# Patient Record
Sex: Female | Born: 1995 | Race: White | Hispanic: No | Marital: Single | Smoking: Never smoker
Health system: Southern US, Community
[De-identification: ages and names within clinical notes are randomized; demographics above are authoritative.]

## PROBLEM LIST (undated history)

## (undated) DIAGNOSIS — G40209 Localization-related (focal) (partial) symptomatic epilepsy and epileptic syndromes with complex partial seizures, not intractable, without status epilepticus: Secondary | ICD-10-CM

## (undated) DIAGNOSIS — F329 Major depressive disorder, single episode, unspecified: Secondary | ICD-10-CM

## (undated) DIAGNOSIS — F32A Depression, unspecified: Secondary | ICD-10-CM

## (undated) DIAGNOSIS — S060XAA Concussion with loss of consciousness status unknown, initial encounter: Secondary | ICD-10-CM

## (undated) DIAGNOSIS — J45909 Unspecified asthma, uncomplicated: Secondary | ICD-10-CM

## (undated) DIAGNOSIS — M1990 Unspecified osteoarthritis, unspecified site: Secondary | ICD-10-CM

## (undated) DIAGNOSIS — K219 Gastro-esophageal reflux disease without esophagitis: Secondary | ICD-10-CM

## (undated) DIAGNOSIS — F909 Attention-deficit hyperactivity disorder, unspecified type: Secondary | ICD-10-CM

## (undated) HISTORY — DX: Localization-related (focal) (partial) symptomatic epilepsy and epileptic syndromes with complex partial seizures, not intractable, without status epilepticus: G40.209

## (undated) HISTORY — DX: Attention-deficit hyperactivity disorder, unspecified type: F90.9

## (undated) HISTORY — DX: Concussion with loss of consciousness status unknown, initial encounter: S06.0XAA

## (undated) HISTORY — DX: Unspecified asthma, uncomplicated: J45.909

## (undated) HISTORY — DX: Gastro-esophageal reflux disease without esophagitis: K21.9

---

## 2013-03-01 ENCOUNTER — Encounter (HOSPITAL_COMMUNITY): Payer: Self-pay | Admitting: Emergency Medicine

## 2013-03-01 ENCOUNTER — Emergency Department (HOSPITAL_COMMUNITY)
Admission: EM | Admit: 2013-03-01 | Discharge: 2013-03-01 | Disposition: A | Discharge status: Home / Self Care | Payer: BC Managed Care – PPO | Attending: Emergency Medicine | Admitting: Emergency Medicine

## 2013-03-01 DIAGNOSIS — H9202 Otalgia, left ear: Secondary | ICD-10-CM

## 2013-03-01 DIAGNOSIS — H6120 Impacted cerumen, unspecified ear: Secondary | ICD-10-CM | POA: Insufficient documentation

## 2013-03-01 DIAGNOSIS — H6122 Impacted cerumen, left ear: Secondary | ICD-10-CM

## 2013-03-01 HISTORY — DX: Major depressive disorder, single episode, unspecified: F32.9

## 2013-03-01 HISTORY — DX: Depression, unspecified: F32.A

## 2013-03-01 HISTORY — DX: Unspecified osteoarthritis, unspecified site: M19.90

## 2013-03-01 MED ORDER — IBUPROFEN 800 MG PO TABS: 800.0000 mg | ORAL_TABLET | Freq: Three times a day (TID) | ORAL | Status: DC | PRN

## 2013-03-01 NOTE — ED Provider Notes (Signed)
CSN: [id number]     Arrival date & time 03/01/13  1217 History  This chart was scribed for non-physician practitioner Ebbie Ridge, PA, working with Lyanne Co, MD by Ronal Fear, ED scribe. This patient was seen in room WTR9/WTR9 and the patient's care was started at 12:28 PM.    Chief Complaint  Patient presents with  . Otalgia   The history is provided by the patient. No language interpreter was used.    HPI Comments: Sonia Gordon is a 17 y.o. female who presents to the Emergency Department complaining of a left ear problem onset 9 am this morning.  She states that when she yawns it feels like there is something moving inside her ear.  She has had a live object in her ear before.  Pt does not appear to be in any acute distress with no other complaints.  No past medical history on file. No past surgical history on file. No family history on file. History  Substance Use Topics  . Smoking status: Not on file  . Smokeless tobacco: Not on file  . Alcohol Use: Not on file   OB History   No data available     Review of Systems  HENT: Positive for ear pain.   All other systems reviewed and are negative.    Allergies  Review of patient's allergies indicates not on file.  Home Medications  No current outpatient prescriptions on file. BP 114/64  Pulse 88  Temp(Src) 98.1 F (36.7 C) (Oral)  Resp 18  SpO2 100% Physical Exam  Nursing note and vitals reviewed. Constitutional: She is oriented to person, place, and time. She appears well-developed and well-nourished.  HENT:  Head: Normocephalic and atraumatic.  Right Ear: Tympanic membrane normal.  Left Ear: Tympanic membrane normal.  The patient has wax noted in the L ear canal.   Eyes: Pupils are equal, round, and reactive to light.  Neck: Neck supple.  Cardiovascular: Normal rate, regular rhythm and normal heart sounds.   Pulmonary/Chest: Effort normal. No respiratory distress. She has no wheezes.  Neurological: She is  alert and oriented to person, place, and time.  Skin: Skin is warm and dry.  Psychiatric: She has a normal mood and affect.    ED Course  Procedures (including critical care time) DIAGNOSTIC STUDIES: Oxygen Saturation is 100% on RA, normal by my interpretation.    COORDINATION OF CARE:    12:35 PM- Pt advised of plan for treatment cleaning the ear out and pt agrees.  I personally performed the services described in this documentation, which was scribed in my presence. The recorded information has been reviewed and is accurate.   Carlyle Dolly, PA-C 03/01/13 1309

## 2013-03-01 NOTE — ED Notes (Signed)
Patient began having pain in her ear about 3 hours ago. Unsure of what it is but has had a live object in her ear before and the pain is different today. Has muffled hearing today.

## 2013-03-01 NOTE — ED Provider Notes (Signed)
Medical screening examination/treatment/procedure(s) were performed by non-physician practitioner and as supervising physician I was immediately available for consultation/collaboration.  EKG Interpretation   None         Lyanne Co, MD 03/01/13 1601

## 2016-07-25 ENCOUNTER — Encounter (INDEPENDENT_AMBULATORY_CARE_PROVIDER_SITE_OTHER): Payer: Self-pay | Admitting: Internal Medicine

## 2016-08-01 ENCOUNTER — Encounter (INDEPENDENT_AMBULATORY_CARE_PROVIDER_SITE_OTHER): Payer: Self-pay | Admitting: Internal Medicine

## 2016-08-01 ENCOUNTER — Encounter (INDEPENDENT_AMBULATORY_CARE_PROVIDER_SITE_OTHER): Payer: Self-pay

## 2016-08-01 ENCOUNTER — Ambulatory Visit (INDEPENDENT_AMBULATORY_CARE_PROVIDER_SITE_OTHER): Payer: BLUE CROSS/BLUE SHIELD | Admitting: Internal Medicine

## 2016-08-01 VITALS — BP 110/72 | HR 72 | Temp 98.3°F | Ht 59.0 in | Wt 103.8 lb

## 2016-08-01 DIAGNOSIS — K219 Gastro-esophageal reflux disease without esophagitis: Secondary | ICD-10-CM

## 2016-08-01 DIAGNOSIS — J45909 Unspecified asthma, uncomplicated: Secondary | ICD-10-CM

## 2016-08-01 HISTORY — DX: Unspecified asthma, uncomplicated: J45.909

## 2016-08-01 NOTE — Progress Notes (Addendum)
   Subjective:    Patient ID: Sonia Gordon, female    DOB: [birth year], 21 y.o.   MRN: [id number]  HPI  Referred by Chauncey Reading PA-C for GERD.  She says she has mid abdominal pain. She has had the pain for a couple of years.  She describes the pain as very mild.  Really no pain today. She does have acid reflux and takes Omeprazole as needed. Worse when she eats dairy products and soy, and almonds.  She is avoiding these foods. She avoids spicy foods.  For the most part her appetite is not good.  She has a BM 2-3 times a day.  Seen 07/06/2016 at their office with abdominal pain, nausea, and diarrhea x 3 days.   07/12/2014 total bili 1.2, ALP 58, AST 14, ALT 156  Review of Systems Past Medical History:  Diagnosis Date  . Arthritis   . Asthma 08/01/2016  . Depression     No past surgical history on file.  No Known Allergies  Current Outpatient Prescriptions on File Prior to Visit  Medication Sig Dispense Refill  . ibuprofen (ADVIL,MOTRIN) 800 MG tablet Take 1 tablet (800 mg total) by mouth every 8 (eight) hours as needed. 21 tablet 0   No current facility-administered medications on file prior to visit.        Objective:   Physical Exam Blood pressure 110/72, pulse 72, temperature 98.3 F (36.8 C), height  (1.499 m), weight 103 lb 12.8 oz (47.1 kg).  Alert and oriented. Skin warm and dry. Oral mucosa is moist.   . Sclera anicteric, conjunctivae is pink. Thyroid not enlarged. No cervical lymphadenopathy. Lungs clear. Heart regular rate and rhythm.  Abdomen is soft. Bowel sounds are positive. No hepatomegaly. No abdominal masses felt. No tenderness.  No edema to lower extremities.       Assessment & Plan:  Probable GERD. She will increase the Omeprazole  to daily.  OV in 3 months. GERD diet given to patient.

## 2016-08-01 NOTE — Patient Instructions (Signed)
Take the Omeprazole  Daily. GERD diet.

## 2016-08-07 ENCOUNTER — Encounter (INDEPENDENT_AMBULATORY_CARE_PROVIDER_SITE_OTHER): Payer: Self-pay

## 2016-10-31 ENCOUNTER — Encounter (INDEPENDENT_AMBULATORY_CARE_PROVIDER_SITE_OTHER): Payer: Self-pay

## 2016-10-31 ENCOUNTER — Encounter (INDEPENDENT_AMBULATORY_CARE_PROVIDER_SITE_OTHER): Payer: Self-pay | Admitting: Internal Medicine

## 2016-10-31 ENCOUNTER — Ambulatory Visit (INDEPENDENT_AMBULATORY_CARE_PROVIDER_SITE_OTHER): Payer: BLUE CROSS/BLUE SHIELD | Admitting: Internal Medicine

## 2016-10-31 DIAGNOSIS — K219 Gastro-esophageal reflux disease without esophagitis: Secondary | ICD-10-CM | POA: Diagnosis not present

## 2016-10-31 HISTORY — DX: Gastro-esophageal reflux disease without esophagitis: K21.9

## 2016-10-31 NOTE — Progress Notes (Signed)
   Subjective:    Patient ID: Sonia PilgrimAmber Denapoli, female    DOB: 1996-04-23, 21 y.o.   MRN: [id number] Wt in April 103.  HPI Here today for f/u. She was last seen in April of this year. Hx of GERD. Maintained on Omeprazole 20mg  daily.  She tells she is doing good. Her appetite is good. She is avoiding spicy foods. She is having a BM x 1 every 2-3 days. She continues to work full time.     Review of Systems Past Medical History:  Diagnosis Date  . Arthritis   . Asthma 08/01/2016  . Depression   . GERD (gastroesophageal reflux disease) 10/31/2016    No past surgical history on file.  No Known Allergies  Current Outpatient Prescriptions on File Prior to Visit  Medication Sig Dispense Refill  . albuterol (VENTOLIN HFA) 108 (90 Base) MCG/ACT inhaler Inhale into the lungs every 6 (six) hours as needed for wheezing or shortness of breath.    Marland Kitchen. ibuprofen (ADVIL,MOTRIN) 800 MG tablet Take 1 tablet (800 mg total) by mouth every 8 (eight) hours as needed. 21 tablet 0  . naproxen sodium (ANAPROX) 220 MG tablet Take 220 mg by mouth as needed.    Marland Kitchen. omeprazole (PRILOSEC) 20 MG capsule Take 20 mg by mouth as needed.    . traZODone (DESYREL) 50 MG tablet Take 50 mg by mouth at bedtime.    . Vitamin D, Ergocalciferol, (DRISDOL) 50000 units CAPS capsule Take 50,000 Units by mouth every 7 (seven) days.     No current facility-administered medications on file prior to visit.         Objective:   Physical Exam Blood pressure 100/70, pulse 68, temperature (!) 97.4 F (36.3 C), height 4\' 11"  (1.499 m), weight 111 lb 6.4 oz (50.5 kg).  Alert and oriented. Skin warm and dry. Oral mucosa is moist.   . Sclera anicteric, conjunctivae is pink. Thyroid not enlarged. No cervical lymphadenopathy. Lungs clear. Heart regular rate and rhythm.  Abdomen is soft. Bowel sounds are positive. No hepatomegaly. No abdominal masses felt. No tenderness.  No edema to lower extremities.         Assessment & Plan:  GERD  controlled with Omeprazole. OV in 1 year.

## 2016-10-31 NOTE — Patient Instructions (Signed)
Continue the Omeprazole. OV  In 1 year.

## 2017-10-31 ENCOUNTER — Ambulatory Visit (INDEPENDENT_AMBULATORY_CARE_PROVIDER_SITE_OTHER): Payer: BLUE CROSS/BLUE SHIELD | Admitting: Internal Medicine

## 2017-10-31 ENCOUNTER — Encounter (INDEPENDENT_AMBULATORY_CARE_PROVIDER_SITE_OTHER): Payer: Self-pay | Admitting: Internal Medicine

## 2017-10-31 VITALS — BP 98/72 | HR 60 | Temp 98.0°F | Ht 59.0 in | Wt 105.7 lb

## 2017-10-31 DIAGNOSIS — K219 Gastro-esophageal reflux disease without esophagitis: Secondary | ICD-10-CM

## 2017-10-31 NOTE — Progress Notes (Signed)
   Subjective:    Patient ID: Sonia PilgrimAmber Cress, female    DOB: [birth year], 22 y.o.   MRN: [id number]  HPI Here today for f/u. Last seen in July of 2018. Hx of chronic GERD and maintained on Omeprazole.  She says she is doing good. Her appetite is good. She has lost 5 pounds since her last visit in July. She states she has been walking. Has a BM x 1-2 times a day. No change in stool habits. Stools are formed. No melena or BRRB.  No abdominal pain. GERD controlled with Omeprazole. She avoids lactulose, spicy foods, and tries to avoid greasy foods.     Review of Systems Past Medical History:  Diagnosis Date  . Arthritis   . Asthma 08/01/2016  . Depression   . GERD (gastroesophageal reflux disease) 10/31/2016    History reviewed. No pertinent surgical history.  No Known Allergies  Current Outpatient Medications on File Prior to Visit  Medication Sig Dispense Refill  . albuterol (VENTOLIN HFA) 108 (90 Base) MCG/ACT inhaler Inhale into the lungs every 6 (six) hours as needed for wheezing or shortness of breath.    Marland Kitchen. ibuprofen (ADVIL,MOTRIN) 800 MG tablet Take 1 tablet (800 mg total) by mouth every 8 (eight) hours as needed. 21 tablet 0  . naproxen sodium (ANAPROX) 220 MG tablet Take 220 mg by mouth as needed.    Marland Kitchen. omeprazole (PRILOSEC) 20 MG capsule Take 20 mg by mouth as needed.    . traZODone (DESYREL) 50 MG tablet Take 50 mg by mouth at bedtime.     No current facility-administered medications on file prior to visit.         Objective:   Physical Exam Blood pressure 98/72, pulse 60, temperature 98 F (36.7 C), height 4\' 11"  (1.499 m), weight 105 lb 11.2 oz (47.9 kg).  Alert and oriented. Skin warm and dry. Oral mucosa is moist.   . Sclera anicteric, conjunctivae is pink. Thyroid not enlarged. No cervical lymphadenopathy. Lungs clear. Heart regular rate and rhythm.  Abdomen is soft. Bowel sounds are positive. No hepatomegaly. No abdominal masses felt. No tenderness.  No edema to lower  extremities.         Assessment & Plan:  GERD. She is doing well. She will continue the Omeprazole. OV in 1 year.

## 2017-10-31 NOTE — Patient Instructions (Signed)
OV in 1 year.  

## 2018-10-31 ENCOUNTER — Encounter (INDEPENDENT_AMBULATORY_CARE_PROVIDER_SITE_OTHER): Payer: Self-pay | Admitting: Internal Medicine

## 2018-10-31 ENCOUNTER — Ambulatory Visit (INDEPENDENT_AMBULATORY_CARE_PROVIDER_SITE_OTHER): Payer: BC Managed Care – PPO | Admitting: Internal Medicine

## 2018-10-31 ENCOUNTER — Other Ambulatory Visit: Payer: Self-pay

## 2018-10-31 VITALS — BP 115/71 | HR 101 | Temp 98.4°F | Ht 59.0 in | Wt 107.4 lb

## 2018-10-31 DIAGNOSIS — K219 Gastro-esophageal reflux disease without esophagitis: Secondary | ICD-10-CM | POA: Diagnosis not present

## 2018-10-31 NOTE — Progress Notes (Signed)
   Subjective:    Patient ID: Sonia Gordon, female    DOB: June 03, 1995, 23 y.o.   MRN: [id number]  HPI Here today for f/u. Hx of chronic GERD and maintained on Omeprazole 20mg .  She tells me she is doing good. GERD controlled with Omeprazole. She watches what she eats. She avoids spicy foods. BMs move normal. No melena or BRRB.  NO GI complaints. She exercises by walking to work 1-2 times a weeks (3 miles).     Review of Systems Past Medical History:  Diagnosis Date  . Arthritis   . Asthma 08/01/2016  . Depression   . GERD (gastroesophageal reflux disease) 10/31/2016    History reviewed. No pertinent surgical history.  No Known Allergies  Current Outpatient Medications on File Prior to Visit  Medication Sig Dispense Refill  . albuterol (VENTOLIN HFA) 108 (90 Base) MCG/ACT inhaler Inhale into the lungs every 6 (six) hours as needed for wheezing or shortness of breath.    Marland Kitchen ibuprofen (ADVIL,MOTRIN) 800 MG tablet Take 1 tablet (800 mg total) by mouth every 8 (eight) hours as needed. 21 tablet 0  . naproxen sodium (ANAPROX) 220 MG tablet Take 220 mg by mouth as needed.    Marland Kitchen omeprazole (PRILOSEC) 20 MG capsule Take 20 mg by mouth as needed.    . traZODone (DESYREL) 50 MG tablet Take 50 mg by mouth at bedtime.     No current facility-administered medications on file prior to visit.         Objective:   Physical Exam Blood pressure 115/71, pulse (!) 101, temperature 98.4 F (36.9 C), height 4\' 11"  (1.499 m), weight 107 lb 6.4 oz (48.7 kg). Alert and oriented. Skin warm and dry. Oral mucosa is moist.   . Sclera anicteric, conjunctivae is pink. Thyroid not enlarged. No cervical lymphadenopathy. Lungs clear. Heart regular rate and rhythm.  Abdomen is soft. Bowel sounds are positive. No hepatomegaly. No abdominal masses felt. No tenderness.  No edema to lower extremities.        Assessment & Plan:  GERD. Continue the Omeprazole.  She is doing well. Will see back in 1 year.

## 2018-10-31 NOTE — Patient Instructions (Signed)
Continue the GERD.

## 2018-11-01 ENCOUNTER — Ambulatory Visit (INDEPENDENT_AMBULATORY_CARE_PROVIDER_SITE_OTHER): Payer: BLUE CROSS/BLUE SHIELD | Admitting: Internal Medicine

## 2019-08-15 ENCOUNTER — Encounter (HOSPITAL_COMMUNITY): Payer: Self-pay | Admitting: *Deleted

## 2019-08-15 ENCOUNTER — Emergency Department (HOSPITAL_COMMUNITY)
Admission: EM | Admit: 2019-08-15 | Discharge: 2019-08-15 | Disposition: A | Discharge status: Home / Self Care | Payer: BC Managed Care – PPO | Attending: Emergency Medicine | Admitting: Emergency Medicine

## 2019-08-15 ENCOUNTER — Other Ambulatory Visit: Payer: Self-pay

## 2019-08-15 DIAGNOSIS — A084 Viral intestinal infection, unspecified: Secondary | ICD-10-CM

## 2019-08-15 DIAGNOSIS — J45909 Unspecified asthma, uncomplicated: Secondary | ICD-10-CM | POA: Insufficient documentation

## 2019-08-15 DIAGNOSIS — R112 Nausea with vomiting, unspecified: Secondary | ICD-10-CM | POA: Diagnosis present

## 2019-08-15 DIAGNOSIS — K529 Noninfective gastroenteritis and colitis, unspecified: Secondary | ICD-10-CM | POA: Diagnosis not present

## 2019-08-15 DIAGNOSIS — Z20822 Contact with and (suspected) exposure to covid-19: Secondary | ICD-10-CM | POA: Diagnosis not present

## 2019-08-15 LAB — CBC WITH DIFFERENTIAL/PLATELET
Abs Immature Granulocytes: 0.07 10*3/uL (ref 0.00–0.07)
Basophils Absolute: 0.1 10*3/uL (ref 0.0–0.1)
Basophils Relative: 0 %
Eosinophils Absolute: 0 10*3/uL (ref 0.0–0.5)
Eosinophils Relative: 0 %
HCT: 45.8 % (ref 36.0–46.0)
Hemoglobin: 15.4 g/dL — ABNORMAL HIGH (ref 12.0–15.0)
Immature Granulocytes: 0 %
Lymphocytes Relative: 3 %
Lymphs Abs: 0.4 10*3/uL — ABNORMAL LOW (ref 0.7–4.0)
MCH: 29.9 pg (ref 26.0–34.0)
MCHC: 33.6 g/dL (ref 30.0–36.0)
MCV: 88.9 fL (ref 80.0–100.0)
Monocytes Absolute: 0.5 10*3/uL (ref 0.1–1.0)
Monocytes Relative: 3 %
Neutro Abs: 15.3 10*3/uL — ABNORMAL HIGH (ref 1.7–7.7)
Neutrophils Relative %: 94 %
Platelets: 264 10*3/uL (ref 150–400)
RBC: 5.15 MIL/uL — ABNORMAL HIGH (ref 3.87–5.11)
RDW: 12.1 % (ref 11.5–15.5)
WBC: 16.4 10*3/uL — ABNORMAL HIGH (ref 4.0–10.5)
nRBC: 0 % (ref 0.0–0.2)

## 2019-08-15 LAB — BASIC METABOLIC PANEL
Anion gap: 12 (ref 5–15)
BUN: 12 mg/dL (ref 6–20)
CO2: 20 mmol/L — ABNORMAL LOW (ref 22–32)
Calcium: 9.3 mg/dL (ref 8.9–10.3)
Chloride: 104 mmol/L (ref 98–111)
Creatinine, Ser: 0.61 mg/dL (ref 0.44–1.00)
GFR calc Af Amer: >60 mL/min (ref >60)
GFR calc non Af Amer: >60 mL/min (ref >60)
Glucose, Bld: 114 mg/dL — ABNORMAL HIGH (ref 70–99)
Potassium: 5 mmol/L (ref 3.5–5.1)
Sodium: 136 mmol/L (ref 135–145)

## 2019-08-15 MED ORDER — ONDANSETRON HCL 4 MG/2ML IJ SOLN
4.0000 mg | Freq: Once | INTRAMUSCULAR | Status: AC
  Administered 2019-08-15: 4 mg via INTRAVENOUS
  Filled 2019-08-15: qty 2

## 2019-08-15 MED ORDER — PROCHLORPERAZINE EDISYLATE 10 MG/2ML IJ SOLN
10.0000 mg | Freq: Once | INTRAMUSCULAR | Status: AC
  Administered 2019-08-15: 10 mg via INTRAVENOUS
  Filled 2019-08-15: qty 2

## 2019-08-15 MED ORDER — SODIUM CHLORIDE 0.9 % IV BOLUS
1000.0000 mL | Freq: Once | INTRAVENOUS | Status: AC
  Administered 2019-08-15: 11:00:00 1000 mL via INTRAVENOUS

## 2019-08-15 MED ORDER — DICYCLOMINE HCL 20 MG PO TABS
20.0000 mg | ORAL_TABLET | Freq: Once | ORAL | Status: DC
  Filled 2019-08-15: qty 1

## 2019-08-15 MED ORDER — DICYCLOMINE HCL 10 MG PO CAPS
20.0000 mg | ORAL_CAPSULE | Freq: Once | ORAL | Status: AC
  Administered 2019-08-15: 20 mg via ORAL
  Filled 2019-08-15: qty 2

## 2019-08-15 NOTE — ED Provider Notes (Signed)
[facility] EMERGENCY DEPARTMENT Provider Note  CSN: [id number] Arrival date & time: 08/15/19  4098     History Chief Complaint  Patient presents with  . Abdominal Pain    Sonia Gordon is a 24 y.o. female with a history of reflux, ADHD, asthma, presenting to emergency department nausea vomiting diarrhea.  Patient's brother had similar symptoms 4 days ago, and the patient's mother has had the same symptoms last night.  Patient reports that she has had about 8 episodes of vomiting, with some blood-tinged this morning.  She is also had about 8 episodes of diarrhea as well which is nonbloody.  She denies fevers or chills.  She reports cramping lower abdominal pain.  She denies any respiratory symptoms.  She has not had a Covid vaccine yet.  HPI     Past Medical History:  Diagnosis Date  . Arthritis   . Asthma 08/01/2016  . Depression   . GERD (gastroesophageal reflux disease) 10/31/2016    Patient Active Problem List   Diagnosis Date Noted  . GERD (gastroesophageal reflux disease) 10/31/2016  . Asthma 08/01/2016    History reviewed. No pertinent surgical history.   OB History   No obstetric history on file.     History reviewed. No pertinent family history.  Social History   Tobacco Use  . Smoking status: Never Smoker  . Smokeless tobacco: Never Used  Substance Use Topics  . Alcohol use: No  . Drug use: No    Home Medications Prior to Admission medications   Medication Sig Start Date End Date Taking? Authorizing Provider  albuterol (VENTOLIN HFA) 108 (90 Base) MCG/ACT inhaler Inhale into the lungs every 6 (six) hours as needed for wheezing or shortness of breath.   Yes [provider]  amphetamine-dextroamphetamine (ADDERALL XR) 20 MG 24 hr capsule Take 1 capsule by mouth daily. 08/12/19  Yes [provider]  meloxicam (MOBIC) 7.5 MG tablet Take 7.5 mg by mouth daily. 08/12/19  Yes [provider]  sulfamethoxazole-trimethoprim (BACTRIM DS)  800-160 MG tablet Take 1 tablet by mouth 2 (two) times daily. 08/12/19  Yes [provider]    Allergies    Patient has no known allergies.  Review of Systems   Review of Systems  Constitutional: Negative for chills and fever.  HENT: Negative for ear pain and sore throat.   Eyes: Negative for photophobia and visual disturbance.  Respiratory: Negative for cough and shortness of breath.   Cardiovascular: Negative for chest pain and palpitations.  Gastrointestinal: Positive for abdominal pain, diarrhea, nausea and vomiting. Negative for blood in stool.  Genitourinary: Negative for difficulty urinating, dysuria and hematuria.  Musculoskeletal: Negative for arthralgias and back pain.  Skin: Negative for color change and rash.  Neurological: Negative for syncope and light-headedness.  Psychiatric/Behavioral: Negative for agitation and confusion.  All other systems reviewed and are negative.   Physical Exam Updated Vital Signs BP 120/66 (BP Location: Left Arm)   Pulse 82   Temp 98.1 F (36.7 C) (Oral)   Resp 17   Ht 4\' 11"  (1.499 m)   Wt 49.4 kg   LMP 07/15/2019   SpO2 99%   BMI 22.02 kg/m   Physical Exam Vitals and nursing note reviewed.  Constitutional:      General: She is not in acute distress.    Appearance: She is well-developed.  HENT:     Head: Normocephalic and atraumatic.  Eyes:     Conjunctiva/sclera: Conjunctivae normal.  Cardiovascular:  Rate and Rhythm: Normal rate and regular rhythm.     Heart sounds: No murmur.  Pulmonary:     Effort: Pulmonary effort is normal. No respiratory distress.     Breath sounds: Normal breath sounds.  Abdominal:     Palpations: Abdomen is soft.     Tenderness: There is no abdominal tenderness. There is no right CVA tenderness, left CVA tenderness or guarding. Negative signs include Murphy's sign and McBurney's sign.  Musculoskeletal:     Cervical back: Neck supple.  Skin:    General: Skin is warm and dry.    Neurological:     General: No focal deficit present.     Mental Status: She is alert and oriented to person, place, and time.  Psychiatric:        Mood and Affect: Mood normal.        Behavior: Behavior normal.     ED Results / Procedures / Treatments   Labs (all labs ordered are listed, but only abnormal results are displayed) Labs Reviewed  BASIC METABOLIC PANEL - Abnormal; Notable for the following components:      Result Value   CO2 20 (*)    Glucose, Bld 114 (*)    All other components within normal limits  CBC WITH DIFFERENTIAL/PLATELET - Abnormal; Notable for the following components:   WBC 16.4 (*)    RBC 5.15 (*)    Hemoglobin 15.4 (*)    Neutro Abs 15.3 (*)    Lymphs Abs 0.4 (*)    All other components within normal limits  SARS CORONAVIRUS 2 (TAT 6-24 HRS)    EKG None  Radiology No results found.  Procedures Procedures (including critical care time)  Medications Ordered in ED Medications  sodium chloride 0.9 % bolus 1,000 mL (0 mLs Intravenous Stopped 08/15/19 1223)  ondansetron (ZOFRAN) injection 4 mg (4 mg Intravenous Given 08/15/19 1109)  prochlorperazine (COMPAZINE) injection 10 mg (10 mg Intravenous Given 08/15/19 1110)  dicyclomine (BENTYL) capsule 20 mg (20 mg Oral Given 08/15/19 1110)    ED Course  I have reviewed the triage vital signs and the nursing notes.  Pertinent labs & imaging results that were available during my care of the patient were reviewed by me and considered in my medical decision making (see chart for details).  24 yo female here with n/v/d since last night Multiple family members with the same complaints this week  Strongly suspect this is a viral gastroenteritis No clear food source to suggest food poisoning.  Her brother had these symptoms over 4 days ago, and eats a different diet.  We'll check basic labs to evaluate hydration status, give some IV fluids, IV zofran, IV compazine for nausea and cramping If she can  tolerate PO she can be discharged  Doubtful this is appendicitis, cholecystitis, or acute surgical emergency with this clinical presentation  Overall lower suspicion for COVID without fever or respiratory symptoms, but it is not unreasonable to test them, and she does have asthma, so we'll order the test  Sonia Gordon was evaluated in Emergency Department on 08/15/2019 for the symptoms described in the history of present illness. She was evaluated in the context of the global COVID-19 pandemic, which necessitated consideration that the patient might be at risk for infection with the SARS-CoV-2 virus that causes COVID-19. Institutional protocols and algorithms that pertain to the evaluation of patients at risk for COVID-19 are in a state of rapid change based on information released by regulatory bodies including the  CDC and federal and [employer]. These policies and algorithms were followed during the patient's care in the ED.   Clinical Course as of Aug 14 1829  Fri Aug 15, 2019  1319 Symptoms improved.  Patient is tolerating p.o.  She feels much better.  Suspect leukocytosis is related to her vomiting.  She continues to have a benign abdominal exam and have a lower suspicion for appendicitis.  I will discharge her home with Zofran prescription.   [MT]    Clinical Course User Index [MT] Ronnie Doo, Kermit Balo, MD    Final Clinical Impression(s) / ED Diagnoses Final diagnoses:  Viral gastroenteritis    Rx / DC Orders ED Discharge Orders    None       Renaye Rakers Kermit Balo, MD 08/15/19 [id number]

## 2019-08-15 NOTE — ED Triage Notes (Signed)
Patient comes to the Ed after advised by PCP for evaluation of lower abdominal pan and nausea and vomiting for one day.  Patient reported to EMS some streaks of blood when vomiting.

## 2019-08-16 LAB — SARS CORONAVIRUS 2 (TAT 6-24 HRS): SARS Coronavirus 2: NEGATIVE

## 2019-11-04 ENCOUNTER — Ambulatory Visit (INDEPENDENT_AMBULATORY_CARE_PROVIDER_SITE_OTHER): Payer: BC Managed Care – PPO | Admitting: Internal Medicine

## 2020-09-25 ENCOUNTER — Emergency Department (HOSPITAL_COMMUNITY): Payer: BC Managed Care – PPO

## 2020-09-25 ENCOUNTER — Encounter (HOSPITAL_COMMUNITY): Payer: Self-pay | Admitting: Emergency Medicine

## 2020-09-25 ENCOUNTER — Other Ambulatory Visit: Payer: Self-pay

## 2020-09-25 ENCOUNTER — Emergency Department (HOSPITAL_COMMUNITY)
Admission: EM | Admit: 2020-09-25 | Discharge: 2020-09-26 | Disposition: A | Discharge status: Home / Self Care | Payer: BC Managed Care – PPO | Attending: Emergency Medicine | Admitting: Emergency Medicine

## 2020-09-25 DIAGNOSIS — R42 Dizziness and giddiness: Secondary | ICD-10-CM | POA: Insufficient documentation

## 2020-09-25 DIAGNOSIS — R519 Headache, unspecified: Secondary | ICD-10-CM | POA: Diagnosis not present

## 2020-09-25 DIAGNOSIS — R251 Tremor, unspecified: Secondary | ICD-10-CM | POA: Diagnosis not present

## 2020-09-25 DIAGNOSIS — J45909 Unspecified asthma, uncomplicated: Secondary | ICD-10-CM | POA: Diagnosis not present

## 2020-09-25 LAB — CBC WITH DIFFERENTIAL/PLATELET
Abs Immature Granulocytes: 0.02 10*3/uL (ref 0.00–0.07)
Basophils Absolute: 0 10*3/uL (ref 0.0–0.1)
Basophils Relative: 0 %
Eosinophils Absolute: 0 10*3/uL (ref 0.0–0.5)
Eosinophils Relative: 0 %
HCT: 39.9 % (ref 36.0–46.0)
Hemoglobin: 13.2 g/dL (ref 12.0–15.0)
Immature Granulocytes: 0 %
Lymphocytes Relative: 24 %
Lymphs Abs: 1.7 10*3/uL (ref 0.7–4.0)
MCH: 29.2 pg (ref 26.0–34.0)
MCHC: 33.1 g/dL (ref 30.0–36.0)
MCV: 88.3 fL (ref 80.0–100.0)
Monocytes Absolute: 0.3 10*3/uL (ref 0.1–1.0)
Monocytes Relative: 5 %
Neutro Abs: 4.9 10*3/uL (ref 1.7–7.7)
Neutrophils Relative %: 71 %
Platelets: 300 10*3/uL (ref 150–400)
RBC: 4.52 MIL/uL (ref 3.87–5.11)
RDW: 12.1 % (ref 11.5–15.5)
WBC: 7 10*3/uL (ref 4.0–10.5)
nRBC: 0 % (ref 0.0–0.2)

## 2020-09-25 LAB — BASIC METABOLIC PANEL
Anion gap: 7 (ref 5–15)
BUN: 7 mg/dL (ref 6–20)
CO2: 26 mmol/L (ref 22–32)
Calcium: 8.9 mg/dL (ref 8.9–10.3)
Chloride: 104 mmol/L (ref 98–111)
Creatinine, Ser: 0.58 mg/dL (ref 0.44–1.00)
GFR, Estimated: >60 mL/min (ref >60)
Glucose, Bld: 93 mg/dL (ref 70–99)
Potassium: 3.4 mmol/L — ABNORMAL LOW (ref 3.5–5.1)
Sodium: 137 mmol/L (ref 135–145)

## 2020-09-25 LAB — CBG MONITORING, ED: Glucose-Capillary: 84 mg/dL (ref 70–99)

## 2020-09-25 MED ORDER — SODIUM CHLORIDE 0.9 % IV BOLUS
1000.0000 mL | Freq: Once | INTRAVENOUS | Status: AC
  Administered 2020-09-25: 1000 mL via INTRAVENOUS

## 2020-09-25 NOTE — ED Provider Notes (Signed)
[facility] EMERGENCY DEPARTMENT Provider Note   CSN: [id number] Arrival date & time: 09/25/20  2124     History Chief Complaint  Patient presents with  . Dizziness    Sonia Gordon is a 26 y.o. female.  Patient states while she is at work she got a headache felt dizzy and felt like she was shaking.  No fever no chills no vomiting  The history is provided by the patient and medical records. No language interpreter was used.  Dizziness Quality:  Lightheadedness Severity:  Mild Onset quality:  Sudden Timing:  Intermittent Chronicity:  New Context: not with bowel movement   Associated symptoms: headaches   Associated symptoms: no chest pain and no diarrhea        Past Medical History:  Diagnosis Date  . Arthritis   . Asthma 08/01/2016  . Depression   . GERD (gastroesophageal reflux disease) 10/31/2016    Patient Active Problem List   Diagnosis Date Noted  . GERD (gastroesophageal reflux disease) 10/31/2016  . Asthma 08/01/2016    History reviewed. No pertinent surgical history.   OB History   No obstetric history on file.     History reviewed. No pertinent family history.  Social History   Tobacco Use  . Smoking status: Never Smoker  . Smokeless tobacco: Never Used  Substance Use Topics  . Alcohol use: No  . Drug use: No    Home Medications Prior to Admission medications   Medication Sig Start Date End Date Taking? Authorizing Provider  albuterol (VENTOLIN HFA) 108 (90 Base) MCG/ACT inhaler Inhale into the lungs every 6 (six) hours as needed for wheezing or shortness of breath.    [provider]  amphetamine-dextroamphetamine (ADDERALL XR) 20 MG 24 hr capsule Take 1 capsule by mouth daily. 08/12/19   [provider]  meloxicam (MOBIC) 7.5 MG tablet Take 7.5 mg by mouth daily. 08/12/19   [provider]  sulfamethoxazole-trimethoprim (BACTRIM DS) 800-160 MG tablet Take 1 tablet by mouth 2 (two) times daily. 08/12/19   [provider]    Allergies    Patient has no known allergies.  Review of Systems   Review of Systems  Constitutional: Negative for appetite change and fatigue.  HENT: Negative for congestion, ear discharge and sinus pressure.   Eyes: Negative for discharge.  Respiratory: Negative for cough.   Cardiovascular: Negative for chest pain.  Gastrointestinal: Negative for abdominal pain and diarrhea.  Genitourinary: Negative for frequency and hematuria.  Musculoskeletal: Negative for back pain.  Skin: Negative for rash.  Neurological: Positive for dizziness and headaches. Negative for seizures.  Psychiatric/Behavioral: Negative for hallucinations.    Physical Exam Updated Vital Signs BP 122/84   Pulse 88   Temp 98.6 F (37 C) (Oral)   Resp (!) 21   Ht 4\' 11"  (1.499 m)   Wt 46.3 kg   SpO2 99%   BMI 20.60 kg/m   Physical Exam Vitals and nursing note reviewed.  Constitutional:      Appearance: She is well-developed.  HENT:     Head: Normocephalic.     Nose: Nose normal.  Eyes:     General: No scleral icterus.    Conjunctiva/sclera: Conjunctivae normal.  Neck:     Thyroid: No thyromegaly.  Cardiovascular:     Rate and Rhythm: Normal rate and regular rhythm.     Heart sounds: No murmur heard. No friction rub. No gallop.   Pulmonary:     Breath sounds: No stridor. No wheezing  or rales.  Chest:     Chest wall: No tenderness.  Abdominal:     General: There is no distension.     Tenderness: There is no abdominal tenderness. There is no rebound.  Musculoskeletal:        General: Normal range of motion.     Cervical back: Neck supple.  Lymphadenopathy:     Cervical: No cervical adenopathy.  Skin:    Findings: No erythema or rash.  Neurological:     Mental Status: She is alert and oriented to person, place, and time.     Motor: No abnormal muscle tone.     Coordination: Coordination normal.  Psychiatric:        Behavior: Behavior normal.     ED Results /  Procedures / Treatments   Labs (all labs ordered are listed, but only abnormal results are displayed) Labs Reviewed  BASIC METABOLIC PANEL  CBC WITH DIFFERENTIAL/PLATELET  URINALYSIS, ROUTINE W REFLEX MICROSCOPIC  PREGNANCY, URINE  CBG MONITORING, ED    EKG None  Radiology No results found.  Procedures Procedures   Medications Ordered in ED Medications  sodium chloride 0.9 % bolus 1,000 mL (1,000 mLs Intravenous New Bag/Given 09/25/20 2246)    ED Course  I have reviewed the triage vital signs and the nursing notes.  Pertinent labs & imaging results that were available during my care of the patient were reviewed by me and considered in my medical decision making (see chart for details).    MDM Rules/Calculators/A&P                          Patient with headache and dizziness that improved with fluids.  Also Tylenol.  Labs unremarkable.  Patient will follow up with PCP Final Clinical Impression(s) / ED Diagnoses Final diagnoses:  None    Rx / DC Orders ED Discharge Orders    None       Bethann Berkshire, MD 09/27/20 1135

## 2020-09-25 NOTE — ED Notes (Signed)
Pt up to bathroom, steady gait independently observed

## 2020-09-25 NOTE — Discharge Instructions (Addendum)
Drink plenty of fluids and follow-up with family doctor next week for recheck °

## 2020-09-25 NOTE — ED Triage Notes (Signed)
Pt c/o blurred vision tonight while at work and was concerned that she was going to have a seizure or pass out. Pt denies hx of same.

## 2020-09-26 LAB — PREGNANCY, URINE: Preg Test, Ur: NEGATIVE

## 2020-09-26 LAB — URINALYSIS, ROUTINE W REFLEX MICROSCOPIC
Bilirubin Urine: NEGATIVE
Glucose, UA: NEGATIVE mg/dL
Hgb urine dipstick: NEGATIVE
Ketones, ur: NEGATIVE mg/dL
Leukocytes,Ua: NEGATIVE
Nitrite: NEGATIVE
Protein, ur: NEGATIVE mg/dL
Specific Gravity, Urine: 1.005 (ref 1.005–1.030)
pH: 6 (ref 5.0–8.0)

## 2020-09-26 LAB — POC URINE PREG, ED: Preg Test, Ur: NEGATIVE

## 2020-09-26 MED ORDER — ACETAMINOPHEN 500 MG PO TABS
1000.0000 mg | ORAL_TABLET | Freq: Once | ORAL | Status: AC
  Administered 2020-09-26: 1000 mg via ORAL
  Filled 2020-09-26: qty 2

## 2020-09-26 NOTE — ED Provider Notes (Signed)
6:19 AM Assumed care from Dr. Estell Harpin, please see their note for full history, physical and decision making until this point. In brief this is a 25 y.o. year old female who presented to the ED tonight with Dizziness     Here with dizziness. No other asymptms. Pending workup.   Workup ok. Patient now with HA even though I had to wake her up from a deep sleep to find that out. Has ambulated multiple times without apparent problems. Stable for discharge.   Discharge instructions, including strict return precautions for new or worsening symptoms, given. Patient and/or family verbalized understanding and agreement with the plan as described.   Labs, studies and imaging reviewed by myself and considered in medical decision making if ordered. Imaging interpreted by radiology.  Labs Reviewed  BASIC METABOLIC PANEL - Abnormal; Notable for the following components:      Result Value   Potassium 3.4 (*)    All other components within normal limits  URINALYSIS, ROUTINE W REFLEX MICROSCOPIC - Abnormal; Notable for the following components:   Color, Urine STRAW (*)    All other components within normal limits  CBC WITH DIFFERENTIAL/PLATELET  PREGNANCY, URINE  CBG MONITORING, ED  POC URINE PREG, ED    CT Head Wo Contrast  Final Result      No follow-ups on file.    Marily Memos, MD 09/26/20 [phone]

## 2020-09-26 NOTE — ED Notes (Signed)
Walked pt in hall..steady on her feet.. did not complain of any dizziness

## 2020-10-11 ENCOUNTER — Other Ambulatory Visit: Payer: Self-pay

## 2020-10-11 DIAGNOSIS — Y990 Civilian activity done for income or pay: Secondary | ICD-10-CM | POA: Diagnosis not present

## 2020-10-11 DIAGNOSIS — J45909 Unspecified asthma, uncomplicated: Secondary | ICD-10-CM | POA: Diagnosis not present

## 2020-10-11 DIAGNOSIS — W208XXA Other cause of strike by thrown, projected or falling object, initial encounter: Secondary | ICD-10-CM | POA: Diagnosis not present

## 2020-10-11 DIAGNOSIS — S0990XA Unspecified injury of head, initial encounter: Secondary | ICD-10-CM | POA: Diagnosis not present

## 2020-10-11 DIAGNOSIS — M542 Cervicalgia: Secondary | ICD-10-CM | POA: Insufficient documentation

## 2020-10-12 ENCOUNTER — Other Ambulatory Visit: Payer: Self-pay

## 2020-10-12 ENCOUNTER — Encounter (HOSPITAL_COMMUNITY): Payer: Self-pay | Admitting: Emergency Medicine

## 2020-10-12 ENCOUNTER — Emergency Department (HOSPITAL_COMMUNITY)
Admission: EM | Admit: 2020-10-12 | Discharge: 2020-10-12 | Disposition: A | Discharge status: Home / Self Care | Payer: BC Managed Care – PPO | Attending: Emergency Medicine | Admitting: Emergency Medicine

## 2020-10-12 ENCOUNTER — Emergency Department (HOSPITAL_COMMUNITY): Payer: BC Managed Care – PPO

## 2020-10-12 DIAGNOSIS — S0990XA Unspecified injury of head, initial encounter: Secondary | ICD-10-CM

## 2020-10-12 NOTE — Discharge Instructions (Addendum)
You were evaluated in the Emergency Department and after careful evaluation, we did not find any emergent condition requiring admission or further testing in the hospital.  Your exam/testing today was overall reassuring.  Your CT scans did not show any significant injuries.  Your symptoms may be due to a concussion.  We recommend mental and physical rest for the next 2 days and slow return to your normal daily activities.  Please return to the Emergency Department if you experience any worsening of your condition.  Thank you for allowing [place] to be a part of your care.

## 2020-10-12 NOTE — ED Provider Notes (Signed)
[facility]-EMERGENCY DEPT [facility] Emergency Department Provider Note MRN:  [id number]  Arrival date & time: 10/12/20     Chief Complaint   Head Injury   History of Present Illness   Sonia Gordon is a 25 y.o. year-old adult with no pertinent past medical history presenting to the ED with chief complaint of head injury.  Patient was stacking wood bundles and the pile began to fall and a 20 pound piece of wood struck her in the back of the head.  Her neck was forced forward.  She is endorsing headache, neck pain.  Denies any other injuries, no chest pain or shortness of breath, no back pain, no numbness or weakness to the arms or legs.  Symptoms are mild to moderate, constant, no exacerbating or alleviating factors.  She reports that she fell asleep during dinner but does not recall this happening.  Review of Systems  A complete 10 system review of systems was obtained and all systems are negative except as noted in the HPI and PMH.   Patient's Health History    Past Medical History:  Diagnosis Date   Arthritis    Asthma 08/01/2016   Depression    GERD (gastroesophageal reflux disease) 10/31/2016    History reviewed. No pertinent surgical history.  History reviewed. No pertinent family history.  Social History   Socioeconomic History   Marital status: Significant Other    Spouse name: Not on file   Number of children: Not on file   Years of education: Not on file   Highest education level: Not on file  Occupational History   Not on file  Tobacco Use   Smoking status: Never   Smokeless tobacco: Never  Substance and Sexual Activity   Alcohol use: No   Drug use: No   Sexual activity: Never  Other Topics Concern   Not on file  Social History Narrative   Not on file   Social Determinants of Health   Financial Resource Strain: Not on file  Food Insecurity: Not on file  Transportation Needs: Not on file  Physical Activity: Not on file  Stress: Not on file  Social  Connections: Not on file  Intimate Partner Violence: Not on file     Physical Exam   Vitals:   10/12/20 0010  BP: 126/82  Pulse: (!) 104  Resp: 16  Temp: 97.9 F (36.6 C)  SpO2: 100%    CONSTITUTIONAL: Well-appearing, NAD NEURO:  Alert and oriented x 3, no focal deficits EYES:  eyes equal and reactive ENT/NECK:  no LAD, no JVD CARDIO: Regular rate, well-perfused, normal S1 and S2 PULM:  CTAB no wheezing or rhonchi GI/GU:  normal bowel sounds, non-distended, non-tender MSK/SPINE:  No gross deformities, no edema SKIN:  no rash, atraumatic PSYCH:  Appropriate speech and behavior  *Additional and/or pertinent findings included in MDM below  Diagnostic and Interventional Summary    EKG Interpretation  Date/Time:    Ventricular Rate:    PR Interval:    QRS Duration:   QT Interval:    QTC Calculation:   R Axis:     Text Interpretation:          Labs Reviewed - No data to display  CT HEAD WO CONTRAST  Final Result    CT CERVICAL SPINE WO CONTRAST  Final Result      Medications - No data to display   Procedures  /  Critical Care Procedures  ED Course and Medical Decision Making  I  have reviewed the triage vital signs, the nursing notes, and pertinent available records from the EMR.  Listed above are laboratory and imaging tests that I personally ordered, reviewed, and interpreted and then considered in my medical decision making (see below for details).  CT to exclude intracranial bleeding or skull fracture or cervical fracture.  If reassuring will be appropriate for discharge with concussion precautions.       Elmer Sow. Pilar Plate, MD [facility] Emergency Medicine [facility] [email or web address]  Final Clinical Impressions(s) / ED Diagnoses     ICD-10-CM   1. Injury of head, initial encounter  S09.90XA       ED Discharge Orders     None        Discharge Instructions Discussed with and Provided to Patient:     Discharge  Instructions      You were evaluated in the Emergency Department and after careful evaluation, we did not find any emergent condition requiring admission or further testing in the hospital.  Your exam/testing today was overall reassuring.  Your CT scans did not show any significant injuries.  Your symptoms may be due to a concussion.  We recommend mental and physical rest for the next 2 days and slow return to your normal daily activities.  Please return to the Emergency Department if you experience any worsening of your condition.  Thank you for allowing [place] to be a part of your care.         Sabas Sous, MD 10/12/20 [phone]

## 2020-10-12 NOTE — ED Triage Notes (Signed)
Pt states she dropped about 20lb of wood shaving on her head while at work at 1800. Pt c/o headache now.

## 2020-12-18 ENCOUNTER — Other Ambulatory Visit: Payer: Self-pay

## 2020-12-18 DIAGNOSIS — J45909 Unspecified asthma, uncomplicated: Secondary | ICD-10-CM | POA: Insufficient documentation

## 2020-12-18 DIAGNOSIS — E871 Hypo-osmolality and hyponatremia: Secondary | ICD-10-CM | POA: Insufficient documentation

## 2020-12-18 DIAGNOSIS — R0600 Dyspnea, unspecified: Secondary | ICD-10-CM | POA: Diagnosis not present

## 2020-12-18 DIAGNOSIS — R519 Headache, unspecified: Secondary | ICD-10-CM | POA: Insufficient documentation

## 2020-12-18 DIAGNOSIS — R002 Palpitations: Secondary | ICD-10-CM | POA: Insufficient documentation

## 2020-12-18 DIAGNOSIS — R0789 Other chest pain: Secondary | ICD-10-CM | POA: Insufficient documentation

## 2020-12-18 NOTE — ED Triage Notes (Signed)
Pt presents to ED from home with c/o chest pain, headache and weakness x months. Pt says tonight episode last longer. Pt says she has seen her PCP, Dr Dahlia Client three times and he can't find anything wrong. Pt alert and oriented and ambulatory with steady gait.

## 2020-12-19 ENCOUNTER — Emergency Department (HOSPITAL_COMMUNITY): Payer: BC Managed Care – PPO

## 2020-12-19 ENCOUNTER — Encounter (HOSPITAL_COMMUNITY): Payer: Self-pay

## 2020-12-19 ENCOUNTER — Emergency Department (HOSPITAL_COMMUNITY)
Admission: EM | Admit: 2020-12-19 | Discharge: 2020-12-19 | Disposition: A | Discharge status: Home / Self Care | Payer: BC Managed Care – PPO | Attending: Emergency Medicine | Admitting: Emergency Medicine

## 2020-12-19 DIAGNOSIS — E871 Hypo-osmolality and hyponatremia: Secondary | ICD-10-CM

## 2020-12-19 DIAGNOSIS — R002 Palpitations: Secondary | ICD-10-CM

## 2020-12-19 DIAGNOSIS — R519 Headache, unspecified: Secondary | ICD-10-CM

## 2020-12-19 DIAGNOSIS — R0789 Other chest pain: Secondary | ICD-10-CM

## 2020-12-19 LAB — CBC WITH DIFFERENTIAL/PLATELET
Abs Immature Granulocytes: 0.01 10*3/uL (ref 0.00–0.07)
Basophils Absolute: 0 10*3/uL (ref 0.0–0.1)
Basophils Relative: 1 %
Eosinophils Absolute: 0 10*3/uL (ref 0.0–0.5)
Eosinophils Relative: 0 %
HCT: 40.7 % (ref 36.0–46.0)
Hemoglobin: 13.6 g/dL (ref 12.0–15.0)
Immature Granulocytes: 0 %
Lymphocytes Relative: 26 %
Lymphs Abs: 1.5 10*3/uL (ref 0.7–4.0)
MCH: 29.5 pg (ref 26.0–34.0)
MCHC: 33.4 g/dL (ref 30.0–36.0)
MCV: 88.3 fL (ref 80.0–100.0)
Monocytes Absolute: 0.3 10*3/uL (ref 0.1–1.0)
Monocytes Relative: 6 %
Neutro Abs: 3.9 10*3/uL (ref 1.7–7.7)
Neutrophils Relative %: 67 %
Platelets: 297 10*3/uL (ref 150–400)
RBC: 4.61 MIL/uL (ref 3.87–5.11)
RDW: 12.4 % (ref 11.5–15.5)
WBC: 5.7 10*3/uL (ref 4.0–10.5)
nRBC: 0 % (ref 0.0–0.2)

## 2020-12-19 LAB — D-DIMER, QUANTITATIVE: D-Dimer, Quant: <0.27 ug/mL-FEU (ref 0.00–0.50)

## 2020-12-19 LAB — BASIC METABOLIC PANEL
Anion gap: 7 (ref 5–15)
BUN: 6 mg/dL (ref 6–20)
CO2: 24 mmol/L (ref 22–32)
Calcium: 9 mg/dL (ref 8.9–10.3)
Chloride: 102 mmol/L (ref 98–111)
Creatinine, Ser: 0.64 mg/dL (ref 0.44–1.00)
GFR, Estimated: >60 mL/min (ref >60)
Glucose, Bld: 83 mg/dL (ref 70–99)
Potassium: 3.6 mmol/L (ref 3.5–5.1)
Sodium: 133 mmol/L — ABNORMAL LOW (ref 135–145)

## 2020-12-19 LAB — MAGNESIUM: Magnesium: 2.5 mg/dL — ABNORMAL HIGH (ref 1.7–2.4)

## 2020-12-19 LAB — TROPONIN I (HIGH SENSITIVITY): Troponin I (High Sensitivity): <2 ng/L (ref <18)

## 2020-12-19 MED ORDER — PROCHLORPERAZINE EDISYLATE 10 MG/2ML IJ SOLN
10.0000 mg | Freq: Once | INTRAMUSCULAR | Status: AC
  Administered 2020-12-19: 10 mg via INTRAVENOUS
  Filled 2020-12-19: qty 2

## 2020-12-19 MED ORDER — DIPHENHYDRAMINE HCL 50 MG/ML IJ SOLN
25.0000 mg | Freq: Once | INTRAMUSCULAR | Status: AC
  Administered 2020-12-19: 25 mg via INTRAVENOUS
  Filled 2020-12-19: qty 1

## 2020-12-19 MED ORDER — LACTATED RINGERS IV BOLUS
1000.0000 mL | Freq: Once | INTRAVENOUS | Status: AC
  Administered 2020-12-19: 1000 mL via INTRAVENOUS

## 2020-12-19 MED ORDER — KETOROLAC TROMETHAMINE 30 MG/ML IJ SOLN
15.0000 mg | Freq: Once | INTRAMUSCULAR | Status: AC
  Administered 2020-12-19: 15 mg via INTRAVENOUS
  Filled 2020-12-19: qty 1

## 2020-12-19 NOTE — Discharge Instructions (Addendum)
Your evaluation today did not show any evidence of anything serious.    You may take acetaminophen and/or ibuprofen as needed for pain.    Please follow-up with the cardiologist to evaluate your palpitations.  Return if symptoms are getting worse.

## 2020-12-19 NOTE — ED Provider Notes (Signed)
[facility] EMERGENCY DEPARTMENT Provider Note   CSN: [id number] Arrival date & time: 12/18/20  2340     History Chief Complaint  Patient presents with   Chest Pain    Headache, weakness    Sonia Gordon is a 25 y.o. adult.  The history is provided by the patient.  Chest Pain She has history of GERD, asthma, depression and comes in with multiple complaints.  She describes a midsternal chest pain which has been present intermittently for the last several months.  When present, it can last up to an hour although today it has been present all day.  Pain is described as dull.  Sometimes it is worse if she is supine, sometimes worse if she is sitting.  She does admit to slight dyspnea but denies nausea or diaphoresis.  Pain is not worse with taking a deep breath with.  There have been associated palpitations like her heart is beating in circles.  She has seen her primary care provider who has evaluated without any diagnosis having been made.  She has not done anything to treat the symptoms.  A second complaint is a left temporal headache which has been present since about 6 PM.  This is described as a dull pain without radiation.  She denies photophobia or phonophobia.  She denies nausea, vomiting.  She denies any visual disturbance.  She rates headache at 8/10.  She has not taken anything to treat her headache.  She is a non-smoker and denies history of hypertension, diabetes, hyperlipidemia.  She denies use of exogenous estrogens or recent travel.   Past Medical History:  Diagnosis Date   Arthritis    Asthma 08/01/2016   Depression    GERD (gastroesophageal reflux disease) 10/31/2016    Patient Active Problem List   Diagnosis Date Noted   GERD (gastroesophageal reflux disease) 10/31/2016   Asthma 08/01/2016    History reviewed. No pertinent surgical history.   OB History   No obstetric history on file.     No family history on file.  Social History   Tobacco Use   Smoking  status: Never   Smokeless tobacco: Never  Substance Use Topics   Alcohol use: No   Drug use: No    Home Medications Prior to Admission medications   Medication Sig Start Date End Date Taking? Authorizing Provider  albuterol (VENTOLIN HFA) 108 (90 Base) MCG/ACT inhaler Inhale into the lungs every 6 (six) hours as needed for wheezing or shortness of breath.    [provider]  amphetamine-dextroamphetamine (ADDERALL XR) 20 MG 24 hr capsule Take 1 capsule by mouth daily. 08/12/19   [provider]  meloxicam (MOBIC) 7.5 MG tablet Take 7.5 mg by mouth daily. 08/12/19   [provider]  sulfamethoxazole-trimethoprim (BACTRIM DS) 800-160 MG tablet Take 1 tablet by mouth 2 (two) times daily. 08/12/19   [provider]    Allergies    Patient has no known allergies.  Review of Systems   Review of Systems  Cardiovascular:  Positive for chest pain.  All other systems reviewed and are negative.  Physical Exam Updated Vital Signs BP (!) 123/93 (BP Location: Right Arm)   Pulse (!) 104   Temp (!) 97.5 F (36.4 C) (Oral)   Resp 17   Ht 4\' 11"  (1.499 m)   Wt 45.4 kg   SpO2 98%   BMI 20.20 kg/m   Physical Exam Vitals and nursing note reviewed.  25 year old female, resting comfortably and  in no acute distress. Vital signs are significant for borderline elevated heart rate and borderline elevated blood pressure. Oxygen saturation is 98%, which is normal. Head is normocephalic and atraumatic. PERRLA, EOMI. Oropharynx is clear.  There is tenderness to palpation over the temporalis muscle and insertion of paracervical muscles bilaterally.  Fundi show no hemorrhage, exudate, papilledema. Neck is nontender and supple without adenopathy or JVD. Back is nontender and there is no CVA tenderness. Lungs are clear without rales, wheezes, or rhonchi. Chest is moderately tender in the parasternal area bilaterally.  This does reproduce her pain.  There is no  crepitus. Heart has regular rate and rhythm without murmur. Abdomen is soft, flat, nontender without masses or hepatosplenomegaly and peristalsis is normoactive. Extremities have no cyanosis or edema, full range of motion is present. Skin is warm and dry without rash. Neurologic: Mental status is normal, cranial nerves are intact, there are no motor or sensory deficits.  ED Results / Procedures / Treatments   Labs (all labs ordered are listed, but only abnormal results are displayed) Labs Reviewed  BASIC METABOLIC PANEL - Abnormal; Notable for the following components:      Result Value   Sodium 133 (*)    All other components within normal limits  MAGNESIUM - Abnormal; Notable for the following components:   Magnesium 2.5 (*)    All other components within normal limits  CBC WITH DIFFERENTIAL/PLATELET  D-DIMER, QUANTITATIVE  TROPONIN I (HIGH SENSITIVITY)    EKG EKG Interpretation  Date/Time:  Saturday December 18 2020 23:55:44 EDT Ventricular Rate:  97 PR Interval:  116 QRS Duration: 76 QT Interval:  358 QTC Calculation: 454 R Axis:   79 Text Interpretation: Normal sinus rhythm with sinus arrhythmia Normal ECG When compared with ECG of 09/25/2020, QT has shortened Confirmed by Dione Booze ([id number]) on 12/19/2020 12:06:31 AM  Radiology DG Chest Port 1 View  Result Date: 12/19/2020 CLINICAL DATA:  Chest pain, headache, weakness EXAM: PORTABLE CHEST 1 VIEW COMPARISON:  None. FINDINGS: Lungs are clear.  No pleural effusion or pneumothorax. The heart is normal in size. IMPRESSION: No evidence of acute cardiopulmonary disease. Electronically Signed   By: Charline Bills M.D.   On: 12/19/2020 02:40    Procedures Procedures   Medications Ordered in ED Medications  lactated ringers bolus 1,000 mL (1,000 mLs Intravenous New Bag/Given 12/19/20 0144)  prochlorperazine (COMPAZINE) injection 10 mg (10 mg Intravenous Given 12/19/20 0147)  diphenhydrAMINE (BENADRYL) injection 25 mg (25 mg  Intravenous Given 12/19/20 0145)  ketorolac (TORADOL) 30 MG/ML injection 15 mg (15 mg Intravenous Given 12/19/20 0146)    ED Course  I have reviewed the triage vital signs and the nursing notes.  Pertinent labs & imaging results that were available during my care of the patient were reviewed by me and considered in my medical decision making (see chart for details).   MDM Rules/Calculators/A&P                         Chest pain which seems benign based on history.  She clearly has chest wall tenderness.  ECG is normal.  However given her history of unremarkable outpatient evaluation, will screen for pulmonary embolism with D-dimer.  Headache which seems to be muscle contraction headache based on physical exam.  Doubt migraine variant, subarachnoid hemorrhage.  Old records are reviewed, and she does have a prior ED visit for headache which seems somewhat similar.  She will be given a therapeutic trial  of a headache cocktail, ketorolac for presumed chest wall pain.  Headache is completely resolved with headache cocktail, chest pain is significantly improved with ketorolac.  Chest x-ray shows no acute process.  Laboratory evaluation shows mild hyponatremia which is not felt to be clinically significant.  She is discharged with instructions to use over-the-counter analgesics as needed, follow-up with cardiology for consideration for cardiac event monitor to evaluate her palpitations.  Final Clinical Impression(s) / ED Diagnoses Final diagnoses:  Chest wall pain  Bad headache  Palpitations  Hyponatremia    Rx / DC Orders ED Discharge Orders     None        Dione Booze, MD 12/19/20 [phone]

## 2021-06-01 ENCOUNTER — Emergency Department (HOSPITAL_COMMUNITY)
Admission: EM | Admit: 2021-06-01 | Discharge: 2021-06-01 | Disposition: A | Discharge status: Home / Self Care | Payer: BC Managed Care – PPO | Attending: Emergency Medicine | Admitting: Emergency Medicine

## 2021-06-01 ENCOUNTER — Encounter (HOSPITAL_COMMUNITY): Payer: Self-pay | Admitting: *Deleted

## 2021-06-01 ENCOUNTER — Other Ambulatory Visit: Payer: Self-pay

## 2021-06-01 ENCOUNTER — Emergency Department (HOSPITAL_COMMUNITY): Payer: BC Managed Care – PPO

## 2021-06-01 DIAGNOSIS — R1310 Dysphagia, unspecified: Secondary | ICD-10-CM | POA: Diagnosis present

## 2021-06-01 DIAGNOSIS — J181 Lobar pneumonia, unspecified organism: Secondary | ICD-10-CM | POA: Insufficient documentation

## 2021-06-01 DIAGNOSIS — J189 Pneumonia, unspecified organism: Secondary | ICD-10-CM

## 2021-06-01 DIAGNOSIS — J45909 Unspecified asthma, uncomplicated: Secondary | ICD-10-CM | POA: Diagnosis not present

## 2021-06-01 DIAGNOSIS — R059 Cough, unspecified: Secondary | ICD-10-CM

## 2021-06-01 MED ORDER — AMOXICILLIN-POT CLAVULANATE 875-125 MG PO TABS: 1.0000 | ORAL_TABLET | Freq: Two times a day (BID) | ORAL | 0 refills | Status: AC

## 2021-06-01 MED ORDER — DOXYCYCLINE HYCLATE 100 MG PO CAPS: 100.0000 mg | ORAL_CAPSULE | Freq: Two times a day (BID) | ORAL | 0 refills | Status: AC

## 2021-06-01 NOTE — ED Provider Notes (Signed)
[facility] EMERGENCY DEPARTMENT Provider Note   CSN: [id number] Arrival date & time: 06/01/21  1354     History  No chief complaint on file.   Sonia Gordon is a 26 y.o. adult.  HPI  Patient with medical history including asthma, GERD presents with complaints of difficulty swallowing.  States been going on for about 3 months time has  gotten worse over the last few weeks, states she has pain when she swallows describes as a burning-like sensation,  worsened if she eats solid or dry foods as she feels as if getting stuck in her throat, states that she will gag and cough it back up.  She states she still tolerating p.o. mainly liquids and soft foods, she does note that she has a slight cough nonproductive, generally happens after she eats.  She has no associated fevers chills chest pain shortness of breath general body aches.  She has no history of esophageal strictures, no history of GERD, several experiences in the past.  Denies any alleviating or aggravating factors.  Home Medications Prior to Admission medications   Medication Sig Start Date End Date Taking? Authorizing Provider  albuterol (VENTOLIN HFA) 108 (90 Base) MCG/ACT inhaler Inhale into the lungs every 6 (six) hours as needed for wheezing or shortness of breath.   Yes [provider]  amoxicillin-clavulanate (AUGMENTIN) 875-125 MG tablet Take 1 tablet by mouth every 12 (twelve) hours for 7 days. 06/01/21 06/08/21 Yes Carroll Sage, PA-C  amphetamine-dextroamphetamine (ADDERALL XR) 20 MG 24 hr capsule Take 1 capsule by mouth daily. 08/12/19  Yes [provider]  amphetamine-dextroamphetamine (ADDERALL) 5 MG tablet Take 5 mg by mouth daily. Take 1/2 tablet by mouth daily   Yes [provider]  Aspirin-Acetaminophen (GOODYS BODY PAIN PO) Take 1 packet by mouth daily as needed (pain).   Yes [provider]  bismuth subsalicylate (PEPTO BISMOL) 262 MG chewable tablet Chew 524 mg by mouth as needed.    Yes [provider]  cyclobenzaprine (FLEXERIL) 5 MG tablet Take 5 mg by mouth at bedtime. 05/20/21  Yes [provider]  doxycycline (VIBRAMYCIN) 100 MG capsule Take 1 capsule (100 mg total) by mouth 2 (two) times daily for 7 days. 06/01/21 06/08/21 Yes Carroll Sage, PA-C  hydrOXYzine (ATARAX) 10 MG tablet Take 10 mg by mouth 3 (three) times daily as needed for anxiety or itching. 05/05/21  Yes [provider]  loratadine (CLARITIN) 10 MG tablet Take 10 mg by mouth daily. 08/12/19  Yes [provider]  meloxicam (MOBIC) 15 MG tablet Take 15 mg by mouth daily. 04/19/21  Yes [provider]  methylphenidate 36 MG PO CR tablet Take 36 mg by mouth every morning. 05/05/21   [provider]      Allergies    Almond oil, Lexapro [escitalopram], and Pineapple    Review of Systems   Review of Systems  Constitutional:  Negative for chills and fever.  HENT:  Positive for sore throat and trouble swallowing. Negative for voice change.   Respiratory:  Positive for cough. Negative for shortness of breath.   Cardiovascular:  Negative for chest pain.  Gastrointestinal:  Negative for abdominal pain.  Neurological:  Negative for headaches.   Physical Exam Updated Vital Signs BP 116/76    Pulse 86    Temp 99.2 F (37.3 C) (Oral)    Resp 18    Ht 4\' 11"  (1.499 m)    Wt 45.8 kg    LMP 05/24/2021  SpO2 99%    BMI 20.38 kg/m  Physical Exam Vitals and nursing note reviewed.  Constitutional:      General: [employer]" is not in acute distress.    Appearance: [employer] "Danley Danker" is not ill-appearing.  HENT:     Head: Normocephalic and atraumatic.     Nose: No congestion.     Mouth/Throat:     Mouth: Mucous membranes are moist.     Pharynx: Oropharynx is clear. No oropharyngeal exudate or posterior oropharyngeal erythema.     Comments: No trismus no torticollis tongue uvula midline controlling oral secretions tonsils equal symmetric bilaterally  no tongue elevation. Eyes:     Conjunctiva/sclera: Conjunctivae normal.  Cardiovascular:     Rate and Rhythm: Normal rate and regular rhythm.     Pulses: Normal pulses.     Heart sounds: No murmur heard.   No friction rub. No gallop.  Pulmonary:     Effort: No respiratory distress.     Breath sounds: No wheezing, rhonchi or rales.  Abdominal:     Palpations: Abdomen is soft.     Tenderness: There is no abdominal tenderness. There is no right CVA tenderness or left CVA tenderness.  Skin:    General: Skin is warm and dry.  Neurological:     Mental Status: [employer] "Danley Danker" is alert.  Psychiatric:        Mood and Affect: Mood normal.    ED Results / Procedures / Treatments   Labs (all labs ordered are listed, but only abnormal results are displayed) Labs Reviewed - No data to display  EKG None  Radiology DG Neck Soft Tissue  Result Date: 06/01/2021 CLINICAL DATA:  Sore throat. EXAM: NECK SOFT TISSUES - 1+ VIEW COMPARISON:  CT cervical spine dated October 12, 2020. FINDINGS: There is no evidence of retropharyngeal soft tissue swelling or epiglottic enlargement. The cervical airway is unremarkable and no radio-opaque foreign body identified. IMPRESSION: Negative. Electronically Signed   By: Obie Dredge M.D.   On: 06/01/2021 19:33   DG Chest 1 View  Result Date: 06/01/2021 CLINICAL DATA:  Cough for the past few days. EXAM: CHEST  1 VIEW COMPARISON:  Chest x-ray dated December 19, 2020. FINDINGS: The heart size and mediastinal contours are within normal limits. Normal pulmonary vascularity. Patchy opacities at the right lung base. The left lung is clear. No pleural effusion or pneumothorax. No acute osseous abnormality. IMPRESSION: 1. Right basilar pneumonia. Electronically Signed   By: Obie Dredge M.D.   On: 06/01/2021 19:35    Procedures Procedures    Medications Ordered in ED Medications - No data to display  ED Course/ Medical Decision Making/ A&P                            Medical Decision Making Amount and/or Complexity of Data Reviewed Radiology: ordered.   This patient presents to the ED for concern of difficulty swallowing, this involves an extensive number of treatment options, and is a complaint that carries with it a high risk of complications and morbidity.  The differential diagnosis includes food impaction, strep throat, peritonsillar abscess,    Additional history obtained:  Additional history obtained from N/A  Co morbidities that complicate the patient evaluation  N/A  Social Determinants of Health:  N/A    Lab Tests:  I Ordered, and personally interpreted labs.  The pertinent results include:  N/A   Imaging Studies ordered:  I  ordered imaging studies including chest x-ray, DG of neck I independently visualized and interpreted imaging which showed x-ray shows right lower lobe pneumonia, x-ray of neck is unremarkable I agree with the radiologist interpretation    Reevaluation:  Patient was p.o. challenged, she is tolerating liquids without any difficulty, though symptoms remain stable, updated on imaging she is agreeable for discharge.   Rule out I have low suspicion for a food bolus or impaction as she is still able to tolerate p.o. and via liquid.  Low suspicion  for foreign body in the upper airway there is no stridor on  My exam, no foreign bodies noted on imaging.  Below suspicion for aspiration pneumonia as typically this should be more an upper lobe pneumonia this is likely an incidental finding.  I have suspicion patient be admitted for pneumonia as she is afebrile nontachycardic no new oxygen requirements she has no comorbidities that would increase her risk of hospitalization.    Dispostion and problem list  After consideration of the diagnostic results and the patients response to treatment, I feel that the patent would benefit from discharge.   Difficulty swallowing-unclear etiology of possible esophageal  strictures, will recommend liquid and a soft diet follow-up with GI for further recommendations gave strict return precautions. Pneumonia- will start her on antibiotics, follow-up with PCP in weeks time for reevaluation.            Final Clinical Impression(s) / ED Diagnoses Final diagnoses:  Dysphagia, unspecified type  Community acquired pneumonia of right lower lobe of lung    Rx / DC Orders ED Discharge Orders          Ordered    amoxicillin-clavulanate (AUGMENTIN) 875-125 MG tablet  Every 12 hours        06/01/21 2031    doxycycline (VIBRAMYCIN) 100 MG capsule  2 times daily        06/01/21 2031              Barnie Del 06/01/21 2032    Terrilee Files, MD 06/02/21 [phone]

## 2021-06-01 NOTE — Discharge Instructions (Signed)
Difficulty swallowing-recommend a soft/liquid diet as we do not want you to choke on your food.  Please follow-up with GI for further recommendations Pneumonia-on antibiotics take as prescribed follow-up with PCP in weeks time for reevaluation  Come back to the emergency department if you develop chest pain, shortness of breath, severe abdominal pain, uncontrolled nausea, vomiting, diarrhea.

## 2021-06-01 NOTE — ED Triage Notes (Signed)
Pt states is hard to swallow food/water.  Hurts to talk per pt..  throat feels like it's burning and has a lot of dry heaving.

## 2021-06-05 ENCOUNTER — Emergency Department (HOSPITAL_COMMUNITY)
Admission: EM | Admit: 2021-06-05 | Discharge: 2021-06-06 | Disposition: A | Discharge status: Home / Self Care | Payer: BC Managed Care – PPO | Attending: Emergency Medicine | Admitting: Emergency Medicine

## 2021-06-05 ENCOUNTER — Emergency Department (HOSPITAL_COMMUNITY): Payer: BC Managed Care – PPO

## 2021-06-05 ENCOUNTER — Encounter (HOSPITAL_COMMUNITY): Payer: Self-pay | Admitting: Emergency Medicine

## 2021-06-05 DIAGNOSIS — R42 Dizziness and giddiness: Secondary | ICD-10-CM | POA: Diagnosis not present

## 2021-06-05 DIAGNOSIS — R52 Pain, unspecified: Secondary | ICD-10-CM

## 2021-06-05 DIAGNOSIS — J45909 Unspecified asthma, uncomplicated: Secondary | ICD-10-CM | POA: Diagnosis not present

## 2021-06-05 DIAGNOSIS — R079 Chest pain, unspecified: Secondary | ICD-10-CM | POA: Insufficient documentation

## 2021-06-05 DIAGNOSIS — M791 Myalgia, unspecified site: Secondary | ICD-10-CM | POA: Insufficient documentation

## 2021-06-05 LAB — CBG MONITORING, ED: Glucose-Capillary: 97 mg/dL (ref 70–99)

## 2021-06-05 MED ORDER — ALUM & MAG HYDROXIDE-SIMETH 200-200-20 MG/5ML PO SUSP
30.0000 mL | Freq: Once | ORAL | Status: AC
  Administered 2021-06-05: 30 mL via ORAL
  Filled 2021-06-05: qty 30

## 2021-06-05 NOTE — ED Provider Notes (Signed)
AP-EMERGENCY DEPT [facility] Emergency Department Provider Note MRN:  [id number]  Arrival date & time: 06/06/21     Chief Complaint   Muscle Pain   History of Present Illness   Sonia Gordon is a 26 y.o. year-old adult with a history of asthma, depression, GERD presenting to the ED with chief complaint of muscle pain.  Patient comes with a long list of symptoms that she has noticed and documented throughout the day.  Explains that the symptoms have been happening on and off for 2 years but they are a bit worse over the past 24 hours.  Symptoms include chest burning sensation, lightheadedness, muscle aches, generally feeling unwell, hands appearing the wrong color, and many more.  Review of Systems  A thorough review of systems was obtained and all systems are negative except as noted in the HPI and PMH.   Patient's Health History    Past Medical History:  Diagnosis Date   Arthritis    Asthma 08/01/2016   Depression    GERD (gastroesophageal reflux disease) 10/31/2016    History reviewed. No pertinent surgical history.  History reviewed. No pertinent family history.  Social History   Socioeconomic History   Marital status: Single    Spouse name: Not on file   Number of children: Not on file   Years of education: Not on file   Highest education level: Not on file  Occupational History   Not on file  Tobacco Use   Smoking status: Never   Smokeless tobacco: Never  Substance and Sexual Activity   Alcohol use: No   Drug use: No   Sexual activity: Never  Other Topics Concern   Not on file  Social History Narrative   Not on file   Social Determinants of Health   Financial Resource Strain: Not on file  Food Insecurity: Not on file  Transportation Needs: Not on file  Physical Activity: Not on file  Stress: Not on file  Social Connections: Not on file  Intimate Partner Violence: Not on file     Physical Exam   Vitals:   06/05/21 2238  BP: 114/90  Pulse: 93   Resp: 17  Temp: 98.9 F (37.2 C)  SpO2: 99%    CONSTITUTIONAL: Well-appearing, NAD NEURO/PSYCH:  Alert and oriented x 3, no focal deficits EYES:  eyes equal and reactive ENT/NECK:  no LAD, no JVD CARDIO: Regular rate, well-perfused, normal S1 and S2 PULM:  CTAB no wheezing or rhonchi GI/GU:  non-distended, non-tender MSK/SPINE:  No gross deformities, no edema SKIN:  no rash, atraumatic   *Additional and/or pertinent findings included in MDM below  Diagnostic and Interventional Summary    EKG Interpretation  Date/Time:  Sunday June 05 2021 23:42:41 EST Ventricular Rate:  93 PR Interval:  116 QRS Duration: 90 QT Interval:  371 QTC Calculation: 462 R Axis:   65 Text Interpretation: Sinus rhythm Borderline short PR interval Probable left atrial enlargement Low voltage, precordial leads Confirmed by Kennis Carina [phone]) on 06/06/2021 12:00:19 AM       Labs Reviewed  CBG MONITORING, ED    DG Chest 2 View  Final Result      Medications  alum & mag hydroxide-simeth (MAALOX/MYLANTA) 200-200-20 MG/5ML suspension 30 mL (30 mLs Oral Given 06/05/21 2328)     Procedures  /  Critical Care Procedures  ED Course and Medical Decision Making  Initial Impression and Ddx Patient is well-appearing sitting upright in bed with no increased work of breathing, appears  comfortable.  Vital signs are normal.  Normal range of motion of the arms and legs.  Normal and symmetric strength and sensation, normal coordination, normal speech.  Abdomen is soft and nontender.  Lungs are clear in all fields, she has no abnormal color to her skin or extremities.  She has mostly chronic complaints and her primary care doctor has not been able to tell her the answer.  I suspect most of this is driven by anxiety or somatizations or a hyper awareness.  I see no indication for laboratory testing this evening.  Given the chest pain will obtain screening EKG and chest x-ray.  We will also obtain screening  blood glucose.  If reassuring will be appropriate for discharge.  Past medical/surgical history that increases complexity of ED encounter: None  Interpretation of Diagnostics I personally reviewed the EKG and my interpretation is as follows: Sinus rhythm without abnormal intervals    Blood sugars normal, chest x-ray normal  Patient Reassessment and Ultimate Disposition/Management Appropriate for discharge home.  Patient management required discussion with the following services or consulting groups:  None  Complexity of Problems Addressed Acute complicated illness or Injury  Additional Data Reviewed and Analyzed Further history obtained from: Prior ED visit notes  Additional Factors Impacting ED Encounter Risk None  Elmer Sow. Pilar Plate, MD [facility] Emergency Medicine [facility] [email or web address]  Final Clinical Impressions(s) / ED Diagnoses     ICD-10-CM   1. Chest pain, unspecified type  R07.9     2. Body aches  R52       ED Discharge Orders     None        Discharge Instructions Discussed with and Provided to Patient:     Discharge Instructions      You were evaluated in the Emergency Department and after careful evaluation, we did not find any emergent condition requiring admission or further testing in the hospital.  Your exam/testing today was overall reassuring.  EKG, chest x-ray, and blood sugar were normal.  Recommend follow-up with your regular doctor to further discuss your symptoms.  Please return to the Emergency Department if you experience any worsening of your condition.  Thank you for allowing [place] to be a part of your care.        Sabas Sous, MD 06/06/21 [id number]

## 2021-06-05 NOTE — ED Triage Notes (Addendum)
Pt c/o all over muscle cramps and dizziness since about 740pm tonight. Pt states she has been seen here for the same several times but wasn't ever told why it is happening.

## 2021-06-06 NOTE — Discharge Instructions (Addendum)
You were evaluated in the Emergency Department and after careful evaluation, we did not find any emergent condition requiring admission or further testing in the hospital.  Your exam/testing today was overall reassuring.  EKG, chest x-ray, and blood sugar were normal.  Recommend follow-up with your regular doctor to further discuss your symptoms.  Please return to the Emergency Department if you experience any worsening of your condition.  Thank you for allowing [place] to be a part of your care.

## 2021-08-17 IMAGING — CT CT HEAD W/O CM
3 series · 15 of 47 positions shown, 18 images · non-contrast
Comparison: 09/25/2020

CLINICAL DATA: Pain after dropping would shavings on head

EXAM:
CT HEAD WITHOUT CONTRAST
CT CERVICAL SPINE WITHOUT CONTRAST
TECHNIQUE: Multidetector CT imaging of the head and cervical spine was
performed following the standard protocol without intravenous
contrast. Multiplanar CT image reconstructions of the cervical spine
were also generated.

[Series 2: head w o · axial · 0.43mm/px · z∈[+1412,+1537]mm · 9 of 30 slices shown, 12 images]
[im 3/30  brain]
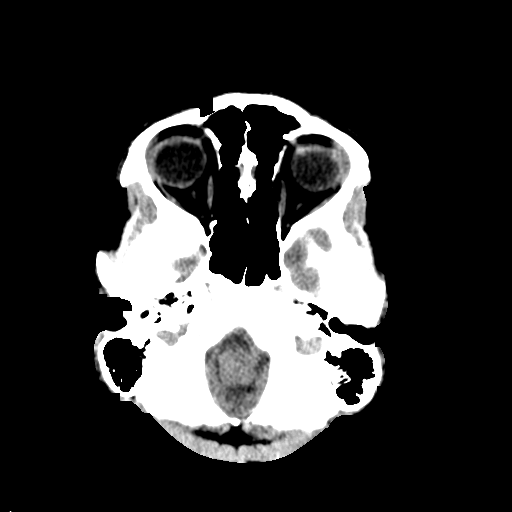
[im 3/30  bone]
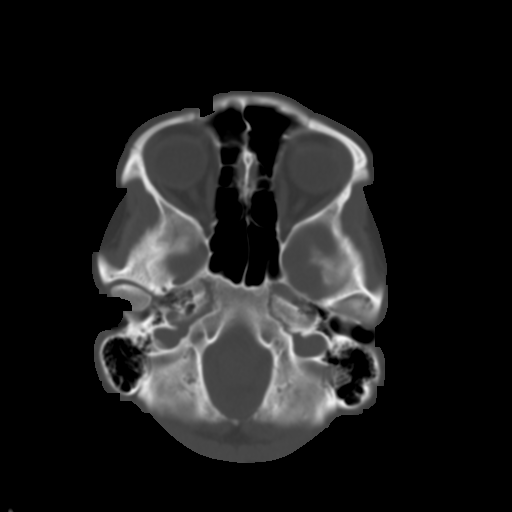
[im 6/30  brain]
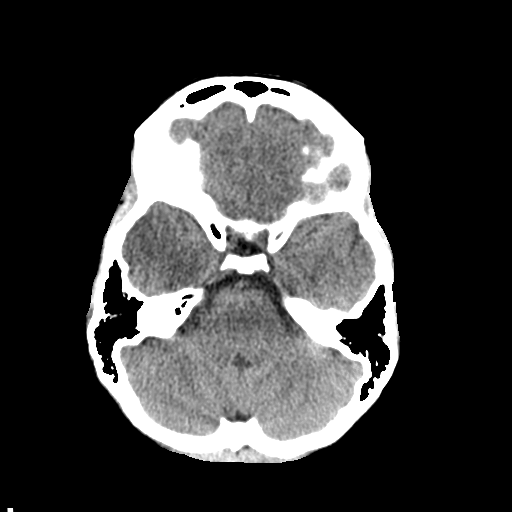
[im 9/30  brain]
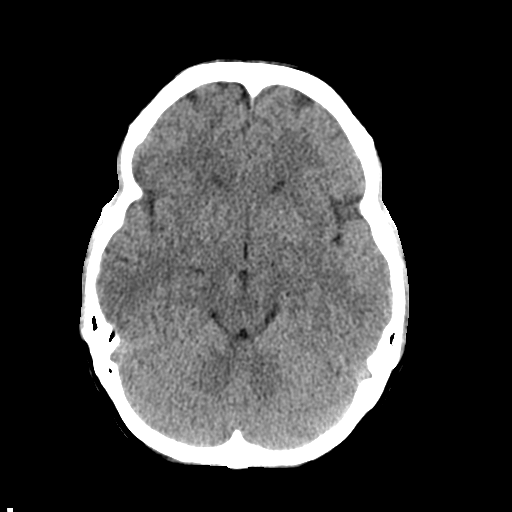
[im 12/30  brain]
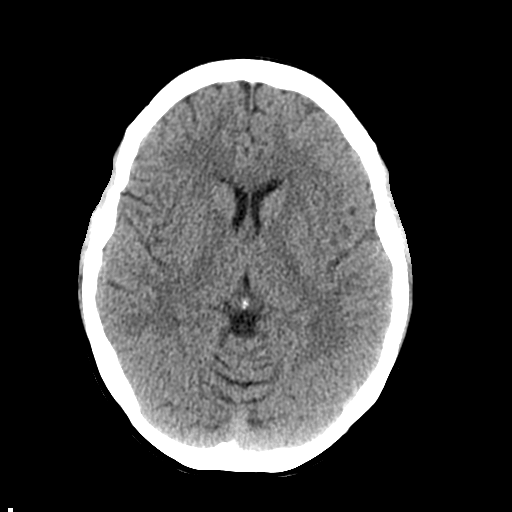
[im 16/30  brain]
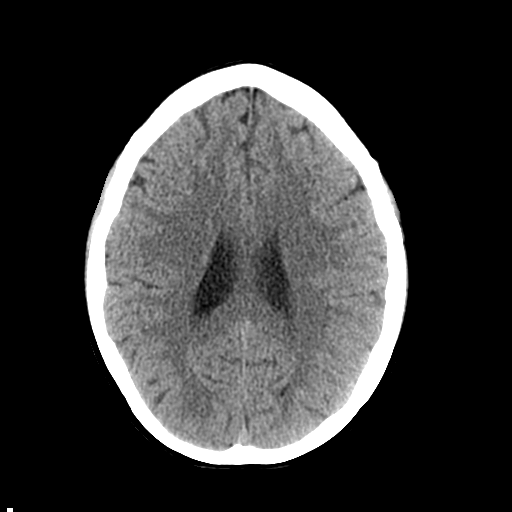
[im 16/30  bone]
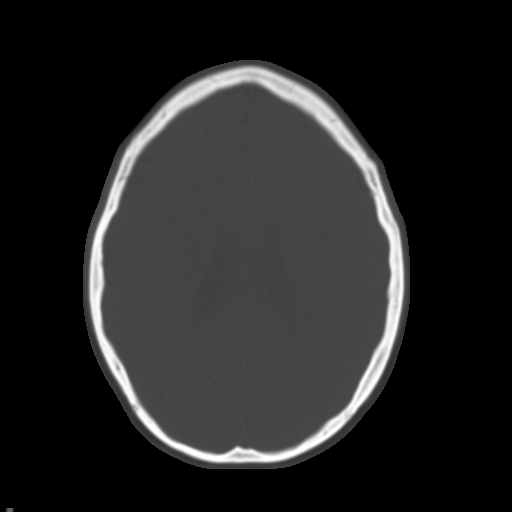
[im 19/30  brain]
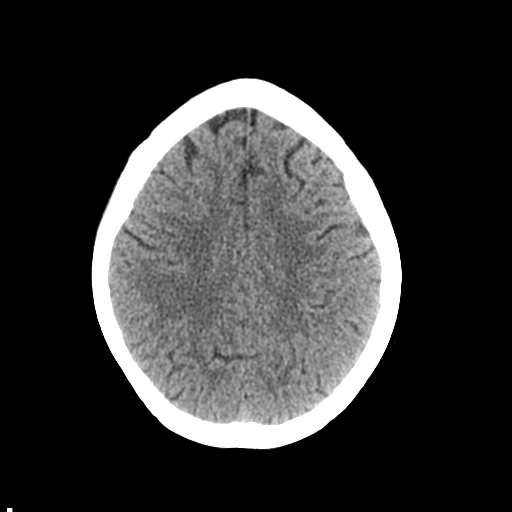
[im 22/30  brain]
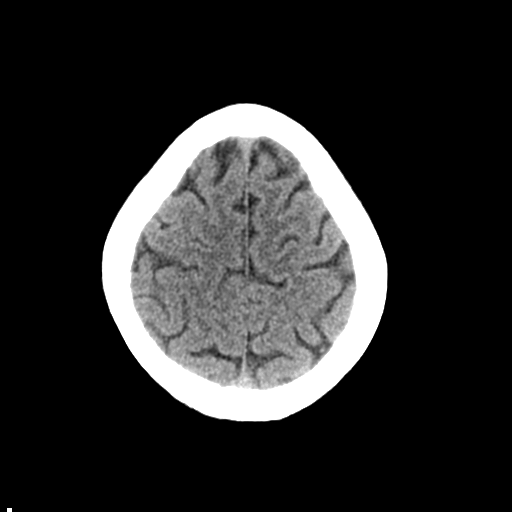
[im 25/30  brain]
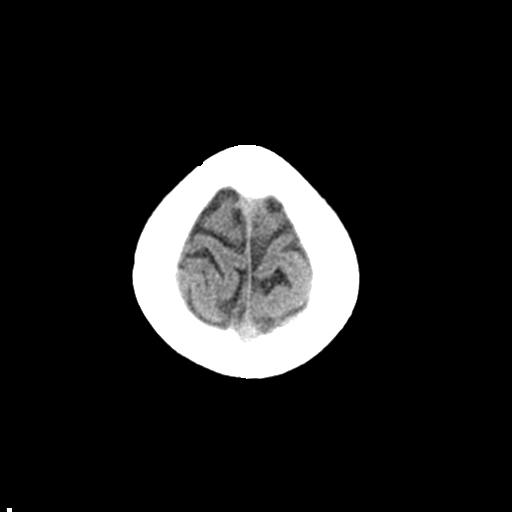
[im 28/30  brain]
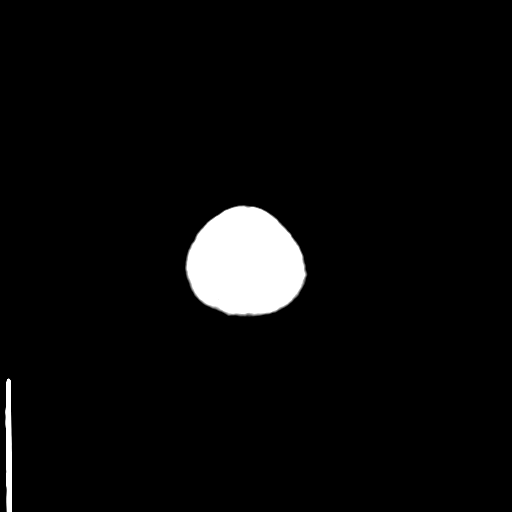
[im 28/30  bone]
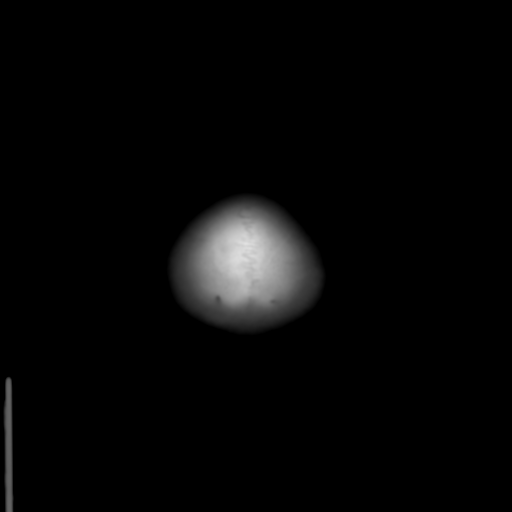

[Series 4: coronal soft · coronal · 0.29mm/px · 3 of 63 slices shown]
[im 21/63  brain]
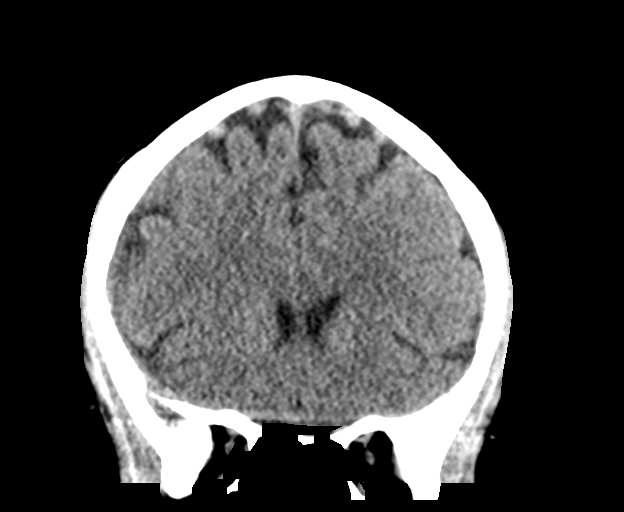
[im 28/63  brain]
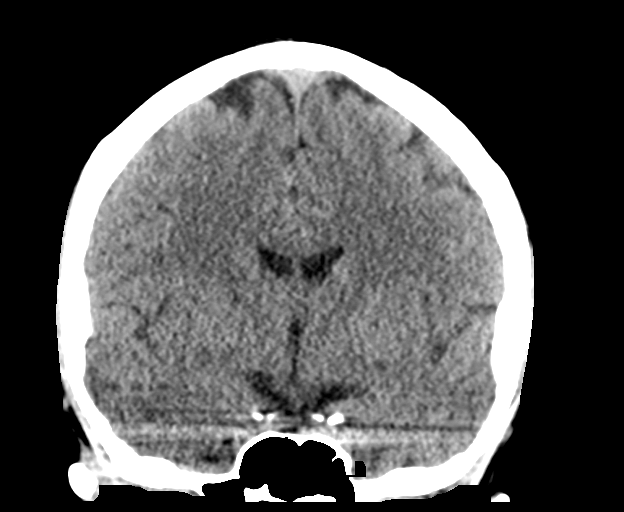
[im 35/63  brain]
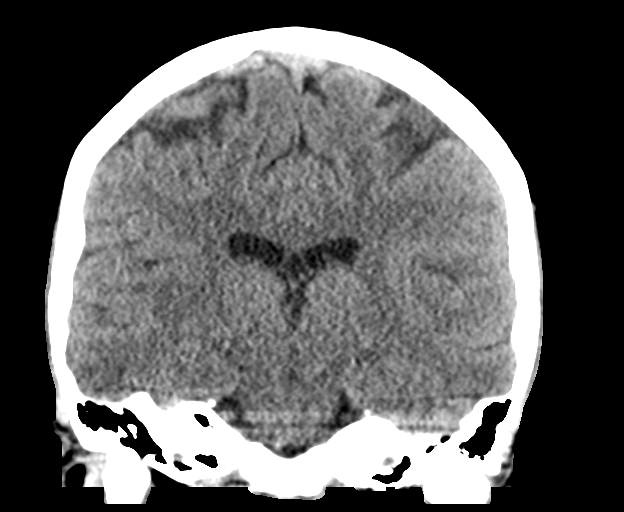

[Series 5: sagittal soft · sagittal · 0.30mm/px · 3 of 50 slices shown]
[im 17/50  brain]
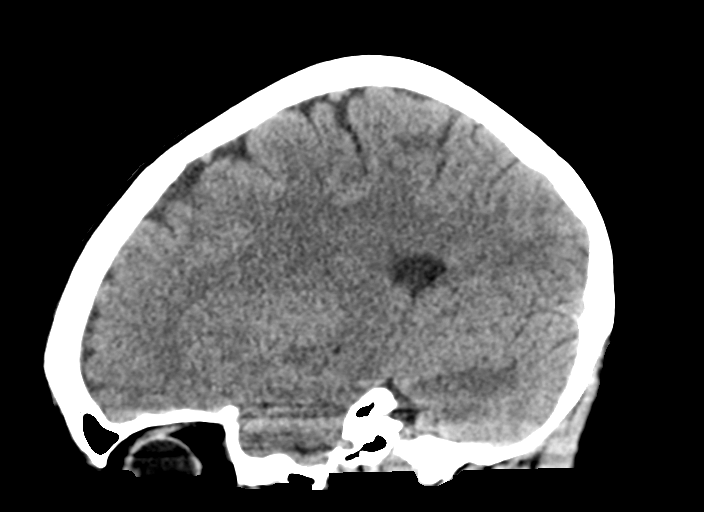
[im 25/50  brain]
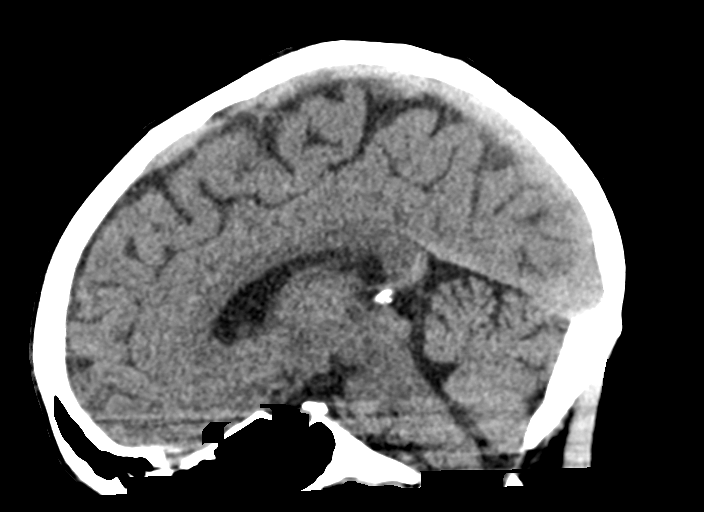
[im 33/50  brain]
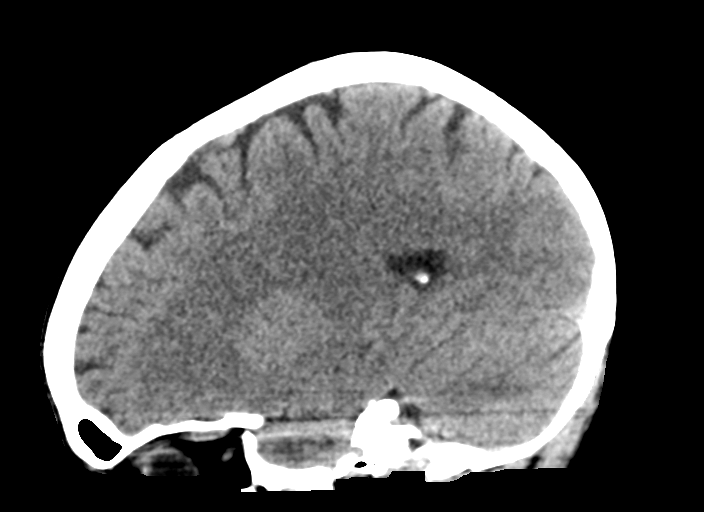

[15 of 47 positions shown; findings below may reference images not displayed]

FINDINGS: CT HEAD FINDINGS

Brain: No evidence of acute infarction, hemorrhage, hydrocephalus,
extra-axial collection or mass lesion/mass effect.

Vascular: No hyperdense vessel or unexpected calcification.

Skull: Normal. Negative for fracture or focal lesion.

Sinuses/Orbits: No acute finding.

Other: None.

CT CERVICAL SPINE FINDINGS

Alignment: Normal.

Skull base and vertebrae: No acute fracture. No primary bone lesion
or focal pathologic process.

Soft tissues and spinal canal: No prevertebral fluid or swelling. No
visible canal hematoma.

Disc levels:  The disc spaces are well preserved.

Upper chest: Negative.

Other: None
IMPRESSION: 1. No acute intracranial abnormalities.
2. Normal appearance of the brain.
3. No evidence for cervical spine fracture.

## 2021-09-29 ENCOUNTER — Encounter: Payer: Self-pay | Admitting: *Deleted

## 2021-09-30 ENCOUNTER — Encounter: Payer: Self-pay | Admitting: Neurology

## 2021-09-30 ENCOUNTER — Ambulatory Visit (INDEPENDENT_AMBULATORY_CARE_PROVIDER_SITE_OTHER): Payer: BC Managed Care – PPO | Admitting: Neurology

## 2021-09-30 VITALS — BP 118/74 | HR 70 | Ht 59.0 in | Wt 107.0 lb

## 2021-09-30 DIAGNOSIS — R569 Unspecified convulsions: Secondary | ICD-10-CM

## 2021-09-30 DIAGNOSIS — R519 Headache, unspecified: Secondary | ICD-10-CM | POA: Diagnosis not present

## 2021-09-30 MED ORDER — GABAPENTIN 100 MG PO CAPS: 100.0000 mg | ORAL_CAPSULE | Freq: Three times a day (TID) | ORAL | 0 refills | Status: DC

## 2021-09-30 NOTE — Progress Notes (Signed)
[facility]  PATIENT: Sonia Gordon DOB: [birth year]  REQUESTING CLINICIAN: Waldon Reining, MD HISTORY FROM: Patient and Darien Ramus  REASON FOR VISIT: Staring episodes, headaches, dizziness    HISTORICAL  CHIEF COMPLAINT:  Chief Complaint  Patient presents with   New Patient (Initial Visit)    Room 12, with friend  NP/Paper/Douglas Dahlia Client MD [facility] [phone] trembling and spasms, ? focal seizures States focal seizures have become more frequent, triggers unknown     HISTORY OF PRESENT ILLNESS:  This is a 26 year old woman past medical history of asthma, anxiety/depression and ADHD who is presenting with complaint of staring spells, headaches blurry vision and dizziness.  She reported history of staring spell, usually last 2 to 3-minute, friend Congo who accompanies the patient states during these episodes, patient is unresponsive, and on one occasion it progressed to generalized shaking.  After the staring spell, patient will experience headache, dizziness and blurred vision.  She is not clear about the frequency of these staring spells. When it comes to headaches, she reports daily headache, she has never been on any preventive medication, headaches are diffuse and throbbing.  Patient reports history of concussion at work last fall.     OTHER MEDICAL CONDITIONS: Asthma, anxiety/depression, ADHD   REVIEW OF SYSTEMS: Full 14 system review of systems performed and negative with exception of:  as noted I the HPI   ALLERGIES: Allergies  Allergen Reactions   Almond Oil    Lexapro [Escitalopram] Other (See Comments)    Dizziness   Pineapple     HOME MEDICATIONS: Outpatient Medications Prior to Visit  Medication Sig Dispense Refill   albuterol (VENTOLIN HFA) 108 (90 Base) MCG/ACT inhaler Inhale into the lungs every 6 (six) hours as needed for wheezing or shortness of breath.     Aspirin-Acetaminophen (GOODYS BODY PAIN PO)  Take 1 packet by mouth daily as needed (pain).     atomoxetine (STRATTERA) 40 MG capsule Take one capsule     atomoxetine (STRATTERA) 40 MG capsule Take 40 mg by mouth 2 (two) times daily.     bismuth subsalicylate (PEPTO BISMOL) 262 MG chewable tablet Chew 524 mg by mouth as needed.     cyclobenzaprine (FLEXERIL) 5 MG tablet Take 5 mg by mouth at bedtime.     DULoxetine (CYMBALTA) 20 MG capsule 2 capules     hydrOXYzine (ATARAX) 10 MG tablet Take 10 mg by mouth 3 (three) times daily as needed for anxiety or itching.     loratadine (CLARITIN) 10 MG tablet Take 10 mg by mouth daily.     Meclizine HCl 25 MG CHEW 1 tablet as needed     amphetamine-dextroamphetamine (ADDERALL XR) 20 MG 24 hr capsule Take 1 capsule by mouth daily.     amphetamine-dextroamphetamine (ADDERALL) 5 MG tablet Take 5 mg by mouth daily. Take 1/2 tablet by mouth daily     meloxicam (MOBIC) 15 MG tablet Take 15 mg by mouth daily.     methylphenidate 36 MG PO CR tablet Take 36 mg by mouth every morning.     No facility-administered medications prior to visit.    PAST MEDICAL HISTORY: Past Medical History:  Diagnosis Date   ADHD    Arthritis    Asthma 08/01/2016   Concussion    2018, 2022   Depression    GERD (gastroesophageal reflux disease) 10/31/2016   Localization-related symptomatic epilepsy and epileptic syndromes with complex partial seizures, not intractable, without status epilepticus (HCC)  PAST SURGICAL HISTORY: No past surgical history on file.  FAMILY HISTORY: Family History  Problem Relation Age of Onset   Hypertension Mother    Stroke Father    Diabetes Father    Crohn's disease Sister    Diabetes Brother     SOCIAL HISTORY: Social History   Socioeconomic History   Marital status: Single    Spouse name: Not on file   Number of children: Not on file   Years of education: Not on file   Highest education level: Some college, no degree  Occupational History   Not on file  Tobacco  Use   Smoking status: Never   Smokeless tobacco: Never  Substance and Sexual Activity   Alcohol use: No   Drug use: No   Sexual activity: Never  Other Topics Concern   Not on file  Social History Narrative   Not on file   Social Determinants of Health   Financial Resource Strain: Not on file  Food Insecurity: Not on file  Transportation Needs: Not on file  Physical Activity: Not on file  Stress: Not on file  Social Connections: Not on file  Intimate Partner Violence: Not on file    PHYSICAL EXAM  GENERAL EXAM/CONSTITUTIONAL: Vitals:  Vitals:   09/30/21 0848  BP: 118/74  Pulse: 70  Weight: 107 lb (48.5 kg)  Height: 4\' 11"  (1.499 m)   Body mass index is 21.61 kg/m. Wt Readings from Last 3 Encounters:  09/30/21 107 lb (48.5 kg)  06/05/21 102 lb (46.3 kg)  06/01/21 100 lb 14.4 oz (45.8 kg)   Patient is in no distress; well developed, nourished and groomed; neck is supple  EYES: Pupils round and reactive to light, Visual fields full to confrontation, Extraocular movements intacts,   MUSCULOSKELETAL: Gait, strength, tone, movements noted in Neurologic exam below  NEUROLOGIC: MENTAL STATUS:      No data to display         awake, alert, oriented to person, place and time recent and remote memory intact normal attention and concentration language fluent, comprehension intact, naming intact fund of knowledge appropriate  CRANIAL NERVE:  2nd, 3rd, 4th, 6th - pupils equal and reactive to light, visual fields full to confrontation, extraocular muscles intact, no nystagmus 5th - facial sensation symmetric 7th - facial strength symmetric 8th - hearing intact 9th - palate elevates symmetrically, uvula midline 11th - shoulder shrug symmetric 12th - tongue protrusion midline  MOTOR:  normal bulk and tone, full strength in the BUE, BLE  SENSORY:  normal and symmetric to light touch, pinprick, temperature, vibration  COORDINATION:  finger-nose-finger, fine  finger movements normal  REFLEXES:  deep tendon reflexes present and symmetric  GAIT/STATION:  normal   DIAGNOSTIC DATA (LABS, IMAGING, TESTING) - I reviewed patient records, labs, notes, testing and imaging myself where available.  Lab Results  Component Value Date   WBC 5.7 12/19/2020   HGB 13.6 12/19/2020   HCT 40.7 12/19/2020   MCV 88.3 12/19/2020   PLT 297 12/19/2020      Component Value Date/Time   NA 133 (L) 12/19/2020 0130   K 3.6 12/19/2020 0130   CL 102 12/19/2020 0130   CO2 24 12/19/2020 0130   GLUCOSE 83 12/19/2020 0130   BUN 6 12/19/2020 0130   CREATININE 0.64 12/19/2020 0130   CALCIUM 9.0 12/19/2020 0130   GFRNONAA >60 12/19/2020 0130   GFRAA >60 08/15/2019 0954   No results found for: "CHOL", "HDL", "LDLCALC", "LDLDIRECT", "TRIG", "CHOLHDL" No  results found for: "HGBA1C" No results found for: "VITAMINB12" No results found for: "TSH"   Head CT 10/12/21 1. No acute intracranial abnormalities. 2. Normal appearance of the brain   ASSESSMENT AND PLAN  26 y.o. year old adult with history of anxiety/depression, ADHD, and asthma who is presenting with complaint of seizure-like events described as staring spell lasting 2 to 3-minutes.  During this time, patient is unresponsive and she does not remember the events.  This is concerning for focal seizures, I will start by obtaining a routine EEG.  If abnormal then we will proceed with MRI brain and we will discuss antiseizure medication.   When he comes to her daily headaches, I will start the patient on nightly gabapentin, she also has a history of anxiety.  We will start with 100 mg nightly, and advised patient to contact me if her headaches are not improved so we can uptitrate the dose.  I will see her in 3 months for follow-up.   1. Seizure-like activity ([place])   2. Chronic daily headache     Patient Instructions  Routine EEG Start gabapentin 100 mg at night for headache prevention Continue with Tylenol,  Advil as needed for headaches Please contact me in 2 to 3 weeks if headaches not improved Follow-up in 3 months  Orders Placed This Encounter  Procedures   EEG adult    Meds ordered this encounter  Medications   gabapentin (NEURONTIN) 100 MG capsule    Sig: Take 1 capsule (100 mg total) by mouth 3 (three) times daily.    Dispense:  90 capsule    Refill:  0    Return in about 3 months (around 12/31/2021).   Alric Ran, MD 09/30/2021, 10:01 AM  [facility] [address] [place], [place] [address] [phone]

## 2021-09-30 NOTE — Patient Instructions (Signed)
Routine EEG Start gabapentin 100 mg at night for headache prevention Continue with Tylenol, Advil as needed for headaches Please contact me in 2 to 3 weeks if headaches not improved Follow-up in 3 months

## 2021-10-05 ENCOUNTER — Ambulatory Visit (INDEPENDENT_AMBULATORY_CARE_PROVIDER_SITE_OTHER): Payer: BC Managed Care – PPO | Admitting: Neurology

## 2021-10-05 DIAGNOSIS — R569 Unspecified convulsions: Secondary | ICD-10-CM | POA: Diagnosis not present

## 2021-10-05 NOTE — Procedures (Signed)
    History:  26 year old woman with seizure like activity   EEG classification: Awake and drowsy  Description of the recording: The background rhythms of this recording consists of a fairly well modulated medium amplitude alpha rhythm of 10-11 Hz that is reactive to eye opening and closure. As the record progresses, the patient appears to remain in the waking state throughout the recording. Photic stimulation was performed, did not show any abnormalities. Hyperventilation was also performed, did not show any abnormalities. Toward the end of the recording, the patient enters the drowsy state with slight symmetric slowing seen. The patient never enters stage II sleep. No abnormal epileptiform discharges seen during this recording. There was no focal slowing. EKG monitor shows no evidence of cardiac rhythm abnormalities with a heart rate of 78.  Abnormality: None   Impression: This is a normal EEG recording in the waking and drowsy state. No evidence of interictal epileptiform discharges seen. A normal EEG does not exclude a diagnosis of epilepsy.    Windell Norfolk, MD [facility]

## 2021-10-06 ENCOUNTER — Other Ambulatory Visit: Payer: Self-pay | Admitting: Neurology

## 2021-10-06 ENCOUNTER — Encounter: Payer: Self-pay | Admitting: Neurology

## 2021-10-06 DIAGNOSIS — R569 Unspecified convulsions: Secondary | ICD-10-CM

## 2022-01-10 ENCOUNTER — Ambulatory Visit (INDEPENDENT_AMBULATORY_CARE_PROVIDER_SITE_OTHER): Payer: BC Managed Care – PPO | Admitting: Neurology

## 2022-01-10 ENCOUNTER — Encounter: Payer: Self-pay | Admitting: Neurology

## 2022-01-10 VITALS — BP 122/64 | HR 93 | Ht 59.0 in | Wt 113.5 lb

## 2022-01-10 DIAGNOSIS — R569 Unspecified convulsions: Secondary | ICD-10-CM

## 2022-01-10 DIAGNOSIS — R519 Headache, unspecified: Secondary | ICD-10-CM

## 2022-01-10 MED ORDER — GABAPENTIN 100 MG PO CAPS: 100.0000 mg | ORAL_CAPSULE | Freq: Two times a day (BID) | ORAL | 1 refills | Status: AC

## 2022-01-10 NOTE — Patient Instructions (Signed)
Increase gabapentin to 100 mg twice daily Ambulatory EEG Contact us if you do have another seizure or seizure-like event Follow-up in 3 months or sooner if worse

## 2022-01-10 NOTE — Progress Notes (Signed)
[facility]  PATIENT: Sonia Gordon DOB: 1996/03/05  REQUESTING CLINICIAN: Leonie Douglas, MD HISTORY FROM: Patient and [name]  REASON FOR VISIT: Staring episodes, headaches, dizziness    HISTORICAL  CHIEF COMPLAINT:  Chief Complaint  Patient presents with   Follow-up    Rm 13. Accompanied by Sonia Gordon. Has reported three events of seizure like activity since her last visit.    INTERVAL HISTORY 01/10/2022:  MR presents today for follow-up, she is accompanied by her friend Sonia Gordon.  Since last visit in June Sonia Gordon has reported 3 staring spells events.  Patient feels that she is having more than 3 but she is just unaware of them.  She has completed her routine EEG which was normal but was unable to complete the ambulatory EEG.  She reports improvement of her headaches but on occasion will have breakthrough headaches.   She provided a description of her seizures. Every so often, unpredictably, I have episodes where my arms legs, legs, fingers, and sometimes my back or jaw will start trembling uncontrollably.  Sometimes, it only last for nearly an hour, or occur multiple times in a row over the course of hours.  It usually accompanied by an intense headaches that burned either above my left ear or at the back right side of my skull or a tingling pins-and-needles feeling all across my head.  I usually know when they are happening, but can always talk to explain it.  Sometimes not always accompanied by intense vertigo, nausea.  Almost always with blurry or shaking vision like even as a cramping. Sometimes, I have no idea what is happening around me when they happen and I fine I am basically momentarily frozen things.  I usually do not remember these and people think I just spaced out a moment, or was purposefully ignoring them.   HISTORY OF PRESENT ILLNESS:  This is a 26 year old woman past medical history of asthma, anxiety/depression and ADHD who is presenting with  complaint of staring spells, headaches blurry vision and dizziness.  She reported history of staring spell, usually last 2 to 3-minute, friend Sonia Gordon who accompanies the patient states during these episodes, patient is unresponsive, and on one occasion it progressed to generalized shaking.  After the staring spell, patient will experience headache, dizziness and blurred vision.  She is not clear about the frequency of these staring spells. When it comes to headaches, she reports daily headache, she has never been on any preventive medication, headaches are diffuse and throbbing.  Patient reports history of concussion at work last fall.     OTHER MEDICAL CONDITIONS: Asthma, anxiety/depression, ADHD   REVIEW OF SYSTEMS: Full 14 system review of systems performed and negative with exception of:  as noted I the HPI   ALLERGIES: Allergies  Allergen Reactions   Almond Oil    Lexapro [Escitalopram] Other (See Comments)    Dizziness   Pineapple     HOME MEDICATIONS: Outpatient Medications Prior to Visit  Medication Sig Dispense Refill   albuterol (VENTOLIN HFA) 108 (90 Base) MCG/ACT inhaler Inhale into the lungs every 6 (six) hours as needed for wheezing or shortness of breath.     Aspirin-Acetaminophen (GOODYS BODY PAIN PO) Take 1 packet by mouth daily as needed (pain).     atomoxetine (STRATTERA) 40 MG capsule Take one capsule     atomoxetine (STRATTERA) 40 MG capsule Take 40 mg by mouth 2 (two) times daily.     bismuth subsalicylate (PEPTO BISMOL) 262 MG chewable tablet  Chew 524 mg by mouth as needed.     cyclobenzaprine (FLEXERIL) 5 MG tablet Take 5 mg by mouth at bedtime.     DULoxetine (CYMBALTA) 20 MG capsule 2 capules     hydrOXYzine (ATARAX) 10 MG tablet Take 10 mg by mouth 3 (three) times daily as needed for anxiety or itching.     loratadine (CLARITIN) 10 MG tablet Take 10 mg by mouth daily.     Meclizine HCl 25 MG CHEW 1 tablet as needed     gabapentin (NEURONTIN) 100 MG capsule  Take 1 capsule (100 mg total) by mouth 3 (three) times daily. 90 capsule 0   No facility-administered medications prior to visit.    PAST MEDICAL HISTORY: Past Medical History:  Diagnosis Date   ADHD    Arthritis    Asthma 08/01/2016   Concussion    2018, 2022   Depression    GERD (gastroesophageal reflux disease) 10/31/2016   Localization-related symptomatic epilepsy and epileptic syndromes with complex partial seizures, not intractable, without status epilepticus ([place])     PAST SURGICAL HISTORY: History reviewed. No pertinent surgical history.  FAMILY HISTORY: Family History  Problem Relation Age of Onset   Hypertension Mother    Stroke Father    Diabetes Father    Crohn's disease Sister    Diabetes Brother     SOCIAL HISTORY: Social History   Socioeconomic History   Marital status: Single    Spouse name: Not on file   Number of children: Not on file   Years of education: Not on file   Highest education level: Some college, no degree  Occupational History   Not on file  Tobacco Use   Smoking status: Never   Smokeless tobacco: Never  Substance and Sexual Activity   Alcohol use: No   Drug use: No   Sexual activity: Never  Other Topics Concern   Not on file  Social History Narrative   Not on file   Social Determinants of Health   Financial Resource Strain: Not on file  Food Insecurity: Not on file  Transportation Needs: Not on file  Physical Activity: Not on file  Stress: Not on file  Social Connections: Not on file  Intimate Partner Violence: Not on file    PHYSICAL EXAM  GENERAL EXAM/CONSTITUTIONAL: Vitals:  Vitals:   01/10/22 1119  BP: 122/64  Pulse: 93  Weight: 113 lb 8 oz (51.5 kg)  Height: 4\' 11"  (1.499 m)    Body mass index is 22.92 kg/m. Wt Readings from Last 3 Encounters:  01/10/22 113 lb 8 oz (51.5 kg)  09/30/21 107 lb (48.5 kg)  06/05/21 102 lb (46.3 kg)   Patient is in no distress; well developed, nourished and groomed;  neck is supple  EYES: Pupils round and reactive to light, Visual fields full to confrontation, Extraocular movements intacts,   MUSCULOSKELETAL: Gait, strength, tone, movements noted in Neurologic exam below  NEUROLOGIC: MENTAL STATUS:      No data to display         awake, alert, oriented to person, place and time recent and remote memory intact normal attention and concentration language fluent, comprehension intact, naming intact fund of knowledge appropriate  CRANIAL NERVE:  2nd, 3rd, 4th, 6th - pupils equal and reactive to light, visual fields full to confrontation, extraocular muscles intact, no nystagmus 5th - facial sensation symmetric 7th - facial strength symmetric 8th - hearing intact 9th - palate elevates symmetrically, uvula midline 11th - shoulder shrug  symmetric 12th - tongue protrusion midline  MOTOR:  normal bulk and tone, full strength in the BUE, BLE  SENSORY:  normal and symmetric to light touch, pinprick, temperature, vibration  COORDINATION:  finger-nose-finger, fine finger movements normal  REFLEXES:  deep tendon reflexes present and symmetric  GAIT/STATION:  normal   DIAGNOSTIC DATA (LABS, IMAGING, TESTING) - I reviewed patient records, labs, notes, testing and imaging myself where available.  Lab Results  Component Value Date   WBC 5.7 12/19/2020   HGB 13.6 12/19/2020   HCT 40.7 12/19/2020   MCV 88.3 12/19/2020   PLT 297 12/19/2020      Component Value Date/Time   NA 133 (L) 12/19/2020 0130   K 3.6 12/19/2020 0130   CL 102 12/19/2020 0130   CO2 24 12/19/2020 0130   GLUCOSE 83 12/19/2020 0130   BUN 6 12/19/2020 0130   CREATININE 0.64 12/19/2020 0130   CALCIUM 9.0 12/19/2020 0130   GFRNONAA >60 12/19/2020 0130   GFRAA >60 08/15/2019 0954   No results found for: "CHOL", "HDL", "LDLCALC", "LDLDIRECT", "TRIG", "CHOLHDL" No results found for: "HGBA1C" No results found for: "VITAMINB12" No results found for: "TSH"   Head  CT 10/12/21 1. No acute intracranial abnormalities. 2. Normal appearance of the brain  Routine EEG 10/05/21: Normal    ASSESSMENT AND PLAN  26 y.o. year old adult with history of anxiety/depression, ADHD, and asthma who is presenting with complaint of events that are concerning for focal seizure. Routine EEG has been normal, she is still experience these events described as staring in space. We will proceed with amb eeg and increase her Gabapentin to 100 mg BID for her headaches.    1. Seizure-like activity ([place])   2. Chronic daily headache      Patient Instructions  Increase gabapentin to 100 mg twice daily Ambulatory EEG Contact us if you do have another seizure or seizure-like event Follow-up in 3 months or sooner if worse  No orders of the defined types were placed in this encounter.   Meds ordered this encounter  Medications   gabapentin (NEURONTIN) 100 MG capsule    Sig: Take 1 capsule (100 mg total) by mouth 2 (two) times daily.    Dispense:  180 capsule    Refill:  1    Return in about 3 months (around 04/11/2022).   Alric Ran, MD 01/10/2022, 11:40 AM  [facility] [address] [place], [place] [address] [phone]

## 2022-01-13 ENCOUNTER — Telehealth: Payer: Self-pay | Admitting: Neurology

## 2022-01-13 NOTE — Telephone Encounter (Signed)
Sonia Gordon Sharyn Lull) calling to get pt's member ID for pt's insurance. Would like a call from the nurse.

## 2022-01-16 NOTE — Telephone Encounter (Signed)
I have printed and will fax back to the company.

## 2022-03-13 ENCOUNTER — Encounter (INDEPENDENT_AMBULATORY_CARE_PROVIDER_SITE_OTHER): Payer: Self-pay | Admitting: Gastroenterology

## 2022-04-20 ENCOUNTER — Ambulatory Visit: Payer: BC Managed Care – PPO | Admitting: Neurology

## 2022-11-06 ENCOUNTER — Other Ambulatory Visit: Payer: Self-pay | Admitting: Family Medicine

## 2022-11-06 DIAGNOSIS — M25532 Pain in left wrist: Secondary | ICD-10-CM

## 2022-11-28 ENCOUNTER — Ambulatory Visit
Admission: RE | Admit: 2022-11-28 | Discharge: 2022-11-28 | Disposition: A | Discharge status: Home / Self Care | Payer: BC Managed Care – PPO | Source: Ambulatory Visit | Attending: Family Medicine | Admitting: Family Medicine

## 2022-11-28 ENCOUNTER — Other Ambulatory Visit: Payer: BC Managed Care – PPO

## 2022-11-28 DIAGNOSIS — M25532 Pain in left wrist: Secondary | ICD-10-CM

## 2023-02-17 ENCOUNTER — Other Ambulatory Visit: Payer: Self-pay

## 2023-02-17 ENCOUNTER — Emergency Department (HOSPITAL_COMMUNITY): Payer: BC Managed Care – PPO

## 2023-02-17 ENCOUNTER — Emergency Department (HOSPITAL_COMMUNITY)
Admission: EM | Admit: 2023-02-17 | Discharge: 2023-02-17 | Disposition: A | Discharge status: Home / Self Care | Payer: Self-pay | Attending: Student | Admitting: Student

## 2023-02-17 DIAGNOSIS — R519 Headache, unspecified: Secondary | ICD-10-CM | POA: Insufficient documentation

## 2023-02-17 DIAGNOSIS — S60512A Abrasion of left hand, initial encounter: Secondary | ICD-10-CM | POA: Insufficient documentation

## 2023-02-17 DIAGNOSIS — M79652 Pain in left thigh: Secondary | ICD-10-CM | POA: Insufficient documentation

## 2023-02-17 DIAGNOSIS — Z7982 Long term (current) use of aspirin: Secondary | ICD-10-CM | POA: Insufficient documentation

## 2023-02-17 DIAGNOSIS — S60511A Abrasion of right hand, initial encounter: Secondary | ICD-10-CM | POA: Insufficient documentation

## 2023-02-17 DIAGNOSIS — J45909 Unspecified asthma, uncomplicated: Secondary | ICD-10-CM | POA: Insufficient documentation

## 2023-02-17 DIAGNOSIS — M25552 Pain in left hip: Secondary | ICD-10-CM | POA: Insufficient documentation

## 2023-02-17 MED ORDER — KETOROLAC TROMETHAMINE 15 MG/ML IJ SOLN
15.0000 mg | Freq: Once | INTRAMUSCULAR | Status: AC
Start: 2023-02-17 — End: 2023-02-17
  Administered 2023-02-17: 15 mg via INTRAMUSCULAR
  Filled 2023-02-17: qty 1
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
## 2023-02-17 NOTE — ED Notes (Signed)
Pts leg abrasion cleaned with sterile water and bactrim applied along with mepilex pad.

## 2023-02-21 ENCOUNTER — Emergency Department (HOSPITAL_COMMUNITY)
Admission: EM | Admit: 2023-02-21 | Discharge: 2023-02-21 | Disposition: A | Discharge status: Home / Self Care | Payer: Medicaid Other | Attending: Emergency Medicine | Admitting: Emergency Medicine

## 2023-02-21 ENCOUNTER — Other Ambulatory Visit: Payer: Self-pay

## 2023-02-21 DIAGNOSIS — S79922D Unspecified injury of left thigh, subsequent encounter: Secondary | ICD-10-CM | POA: Diagnosis present

## 2023-02-21 DIAGNOSIS — S7012XD Contusion of left thigh, subsequent encounter: Secondary | ICD-10-CM | POA: Insufficient documentation

## 2023-02-21 NOTE — Discharge Instructions (Addendum)
Continue to apply ice.  Ice should be applied for 30 minutes at a time, 4 times a day.  You may take ibuprofen or naproxen as needed for pain, add acetaminophen as needed to get additional pain relief.  The bruise will probably take about a month before it goes away.  During that time, it may seem to be spread.  That is normal.
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
## 2023-03-23 ENCOUNTER — Other Ambulatory Visit: Payer: Self-pay

## 2023-03-23 ENCOUNTER — Encounter (HOSPITAL_COMMUNITY): Payer: Self-pay

## 2023-03-23 ENCOUNTER — Emergency Department (HOSPITAL_COMMUNITY): Payer: Self-pay

## 2023-03-23 ENCOUNTER — Emergency Department (HOSPITAL_COMMUNITY)
Admission: EM | Admit: 2023-03-23 | Discharge: 2023-03-24 | Disposition: A | Discharge status: Home / Self Care | Payer: Self-pay | Attending: Emergency Medicine | Admitting: Emergency Medicine

## 2023-03-23 DIAGNOSIS — S52515A Nondisplaced fracture of left radial styloid process, initial encounter for closed fracture: Secondary | ICD-10-CM | POA: Insufficient documentation
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
## 2023-03-24 MED ORDER — OXYCODONE-ACETAMINOPHEN 5-325 MG PO TABS: 1.0000 | ORAL_TABLET | Freq: Four times a day (QID) | ORAL | 0 refills | Status: AC | PRN

## 2023-03-24 MED ORDER — OXYCODONE-ACETAMINOPHEN 5-325 MG PO TABS
1.0000 | ORAL_TABLET | Freq: Once | ORAL | Status: AC
  Administered 2023-03-24: 1 via ORAL
  Filled 2023-03-24: qty 1
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
## 2023-07-18 ENCOUNTER — Other Ambulatory Visit (HOSPITAL_COMMUNITY)
Admission: RE | Admit: 2023-07-18 | Discharge: 2023-07-18 | Disposition: A | Discharge status: Home / Self Care | Source: Ambulatory Visit | Attending: Family Medicine | Admitting: Family Medicine

## 2023-07-18 DIAGNOSIS — R739 Hyperglycemia, unspecified: Secondary | ICD-10-CM | POA: Diagnosis present

## 2023-07-18 LAB — HEMOGLOBIN A1C
Hgb A1c MFr Bld: 4.8 % (ref 4.8–5.6)
Mean Plasma Glucose: 91.06 mg/dL

## 2024-02-06 ENCOUNTER — Encounter (INDEPENDENT_AMBULATORY_CARE_PROVIDER_SITE_OTHER): Payer: Self-pay | Admitting: Gastroenterology
